# Patient Record
Sex: Female | Born: 1950 | Race: White | Hispanic: No | Marital: Married | Smoking: Never smoker
Health system: Southern US, Community
[De-identification: ages and names within clinical notes are randomized; demographics above are authoritative.]

## PROBLEM LIST (undated history)

## (undated) DIAGNOSIS — I10 Essential (primary) hypertension: Secondary | ICD-10-CM

## (undated) DIAGNOSIS — E119 Type 2 diabetes mellitus without complications: Secondary | ICD-10-CM

## (undated) HISTORY — PX: TONSILLECTOMY: SUR1361

## (undated) HISTORY — PX: BREAST BIOPSY: SHX20

## (undated) HISTORY — PX: BREAST EXCISIONAL BIOPSY: SUR124

---

## 2006-07-13 ENCOUNTER — Ambulatory Visit: Payer: Self-pay

## 2007-09-06 ENCOUNTER — Ambulatory Visit: Payer: Self-pay | Admitting: Emergency Medicine

## 2008-12-03 ENCOUNTER — Ambulatory Visit: Payer: Self-pay | Admitting: Family Medicine

## 2009-03-24 ENCOUNTER — Ambulatory Visit: Payer: Self-pay | Admitting: Otolaryngology

## 2009-10-07 ENCOUNTER — Ambulatory Visit: Payer: Self-pay | Admitting: Family Medicine

## 2010-03-13 ENCOUNTER — Ambulatory Visit: Payer: Self-pay | Admitting: Gastroenterology

## 2010-10-13 ENCOUNTER — Ambulatory Visit: Payer: Self-pay

## 2011-03-18 ENCOUNTER — Ambulatory Visit: Payer: Self-pay | Admitting: Pain Medicine

## 2012-01-13 ENCOUNTER — Ambulatory Visit: Payer: Self-pay

## 2012-05-01 ENCOUNTER — Ambulatory Visit: Payer: Self-pay | Admitting: Internal Medicine

## 2013-06-19 ENCOUNTER — Ambulatory Visit: Payer: Self-pay

## 2014-11-20 ENCOUNTER — Ambulatory Visit: Payer: Self-pay | Admitting: Family Medicine

## 2015-10-19 ENCOUNTER — Encounter: Payer: Self-pay | Admitting: Emergency Medicine

## 2015-10-19 ENCOUNTER — Emergency Department
Admission: EM | Admit: 2015-10-19 | Discharge: 2015-10-19 | Disposition: A | Discharge status: Home / Self Care | Payer: Managed Care, Other (non HMO) | Attending: Emergency Medicine | Admitting: Emergency Medicine

## 2015-10-19 ENCOUNTER — Emergency Department: Payer: Managed Care, Other (non HMO)

## 2015-10-19 DIAGNOSIS — S299XXA Unspecified injury of thorax, initial encounter: Secondary | ICD-10-CM | POA: Diagnosis present

## 2015-10-19 DIAGNOSIS — Y998 Other external cause status: Secondary | ICD-10-CM | POA: Diagnosis not present

## 2015-10-19 DIAGNOSIS — W01198A Fall on same level from slipping, tripping and stumbling with subsequent striking against other object, initial encounter: Secondary | ICD-10-CM | POA: Diagnosis not present

## 2015-10-19 DIAGNOSIS — I10 Essential (primary) hypertension: Secondary | ICD-10-CM | POA: Insufficient documentation

## 2015-10-19 DIAGNOSIS — Y9289 Other specified places as the place of occurrence of the external cause: Secondary | ICD-10-CM | POA: Insufficient documentation

## 2015-10-19 DIAGNOSIS — E119 Type 2 diabetes mellitus without complications: Secondary | ICD-10-CM | POA: Insufficient documentation

## 2015-10-19 DIAGNOSIS — Y9389 Activity, other specified: Secondary | ICD-10-CM | POA: Diagnosis not present

## 2015-10-19 DIAGNOSIS — S20221A Contusion of right back wall of thorax, initial encounter: Secondary | ICD-10-CM | POA: Diagnosis not present

## 2015-10-19 DIAGNOSIS — S2231XA Fracture of one rib, right side, initial encounter for closed fracture: Secondary | ICD-10-CM | POA: Insufficient documentation

## 2015-10-19 HISTORY — DX: Essential (primary) hypertension: I10

## 2015-10-19 HISTORY — DX: Type 2 diabetes mellitus without complications: E11.9

## 2015-10-19 MED ORDER — MELOXICAM 15 MG PO TABS
15.0000 mg | ORAL_TABLET | Freq: Every day | ORAL | Status: DC
Start: 2015-10-19 — End: 2017-11-22

## 2015-10-19 MED ORDER — HYDROCODONE-ACETAMINOPHEN 5-325 MG PO TABS: 1.0000 | ORAL_TABLET | ORAL | Status: DC | PRN

## 2015-10-19 MED ORDER — CYCLOBENZAPRINE HCL 10 MG PO TABS: 10.0000 mg | ORAL_TABLET | Freq: Three times a day (TID) | ORAL | Status: DC | PRN

## 2015-10-19 MED ORDER — HYDROCODONE-ACETAMINOPHEN 5-325 MG PO TABS
2.0000 | ORAL_TABLET | Freq: Once | ORAL | Status: AC
  Administered 2015-10-19: 2 via ORAL
  Filled 2015-10-19: qty 2

## 2015-10-19 NOTE — ED Provider Notes (Signed)
[facility] Emergency Department Provider Note  ____________________________________________  Time seen: Approximately 1:01 PM  I have reviewed the triage vital signs and the nursing notes.   HISTORY  Chief Complaint Fall   HPI Janet Sims is a 64 y.o. female who presents to the emergency department for evaluation of right lower rib pain. She states that she slipped while trying to enter the bathtub last night and hit her rib area on the side of the tub. She can "feel something move" when taking a deep breath or making certain movements. She has taken ibuprofen for pain without relief.She denies shortness of breath.   Past Medical History  Diagnosis Date  . Diabetes mellitus without complication (HCC)   . Hypertension     There are no active problems to display for this patient.   Past Surgical History  Procedure Laterality Date  . Tonsillectomy      Current Outpatient Rx  Name  Route  Sig  Dispense  Refill  . cyclobenzaprine (FLEXERIL) 10 MG tablet   Oral   Take 1 tablet (10 mg total) by mouth 3 (three) times daily as needed for muscle spasms.   30 tablet   0   . HYDROcodone-acetaminophen (NORCO/VICODIN) 5-325 MG tablet   Oral   Take 1 tablet by mouth every 4 (four) hours as needed for moderate pain.   20 tablet   0   . meloxicam (MOBIC) 15 MG tablet   Oral   Take 1 tablet (15 mg total) by mouth daily.   30 tablet   0     Allergies Lisinopril  No family history on file.  Social History Social History  Substance Use Topics  . Smoking status: Never Smoker   . Smokeless tobacco: None  . Alcohol Use: No    Review of Systems Constitutional: No recent illness. Eyes: No visual changes. ENT: No sore throat. Cardiovascular: Denies chest pain or palpitations. Respiratory: Denies shortness of breath. Gastrointestinal: No abdominal pain.  Genitourinary: Negative for dysuria. Musculoskeletal: Pain in right posterior rib  area Skin: Negative for rash. Neurological: Negative for headaches, focal weakness or numbness. 10-point ROS otherwise negative.  ____________________________________________   PHYSICAL EXAM:  VITAL SIGNS: ED Triage Vitals  Enc Vitals Group     BP 10/19/15 1142 159/72 mmHg     Pulse Rate 10/19/15 1142 95     Resp 10/19/15 1142 18     Temp 10/19/15 1142 98.1 F (36.7 C)     Temp Source 10/19/15 1142 Oral     SpO2 10/19/15 1142 96 %     Weight 10/19/15 1142 151 lb (68.493 kg)     Height 10/19/15 1142 5\' 2"  (1.575 m)     Head Cir --      Peak Flow --      Pain Score 10/19/15 1146 5     Pain Loc --      Pain Edu? --      Excl. in GC? --     Constitutional: Alert and oriented. Well appearing and in no acute distress. Eyes: Conjunctivae are normal. EOMI. Head: Atraumatic. Nose: No congestion/rhinnorhea. Neck: No stridor.  Respiratory: Normal respiratory effort. Lungs clear to auscultation bilaterally  Musculoskeletal: Contusion noted over the right posterior thoracic area under the site of pain. There is no flail chest noted on exam. Neurologic:  Normal speech and language. No gross focal neurologic deficits are appreciated. Speech is normal. No gait instability. Skin:  Skin is warm, dry and  intact. Atraumatic. Psychiatric: Mood and affect are normal. Speech and behavior are normal.  ____________________________________________   LABS (all labs ordered are listed, but only abnormal results are displayed)  Labs Reviewed - No data to display ____________________________________________  RADIOLOGY  No acute fracture identified. ____________________________________________   PROCEDURES  Procedure(s) performed: None   ____________________________________________   INITIAL IMPRESSION / ASSESSMENT AND PLAN / ED COURSE  Pertinent labs & imaging results that were available during my care of the patient were reviewed by me and considered in my medical decision making  (see chart for details).  Rib fracture diagnosed on clinical exam.  Patient was given pneumonia precautions. She was advised to return to the emergency department immediately for any shortness of breath, fever, or onset of cough. She was advised to follow-up with her primary care provider for symptoms that are not improving over the next 4 weeks ____________________________________________   FINAL CLINICAL IMPRESSION(S) / ED DIAGNOSES  Final diagnoses:  Closed rib fracture, right, initial encounter       Chinita Pester, FNP 10/19/15 1418  Jene Every, MD 10/19/15 1444

## 2015-10-19 NOTE — Discharge Instructions (Signed)

## 2015-10-19 NOTE — ED Notes (Signed)
States she fell and hit this edge of tub having pain to lower back

## 2016-09-03 ENCOUNTER — Other Ambulatory Visit: Payer: Self-pay | Admitting: Family Medicine

## 2016-09-03 DIAGNOSIS — Z1231 Encounter for screening mammogram for malignant neoplasm of breast: Secondary | ICD-10-CM

## 2016-09-13 ENCOUNTER — Ambulatory Visit
Admission: RE | Admit: 2016-09-13 | Discharge: 2016-09-13 | Disposition: A | Discharge status: Home / Self Care | Payer: Managed Care, Other (non HMO) | Source: Ambulatory Visit | Attending: Family Medicine | Admitting: Family Medicine

## 2016-09-13 DIAGNOSIS — Z1231 Encounter for screening mammogram for malignant neoplasm of breast: Secondary | ICD-10-CM

## 2017-07-14 ENCOUNTER — Other Ambulatory Visit: Payer: Self-pay | Admitting: Family Medicine

## 2017-07-14 DIAGNOSIS — I10 Essential (primary) hypertension: Secondary | ICD-10-CM

## 2017-07-14 DIAGNOSIS — E2839 Other primary ovarian failure: Secondary | ICD-10-CM

## 2017-07-18 ENCOUNTER — Ambulatory Visit
Admission: RE | Admit: 2017-07-18 | Discharge: 2017-07-18 | Disposition: A | Discharge status: Home / Self Care | Payer: Managed Care, Other (non HMO) | Source: Ambulatory Visit | Attending: Family Medicine | Admitting: Family Medicine

## 2017-07-18 DIAGNOSIS — E2839 Other primary ovarian failure: Secondary | ICD-10-CM | POA: Diagnosis not present

## 2017-07-18 DIAGNOSIS — I10 Essential (primary) hypertension: Secondary | ICD-10-CM

## 2017-09-20 ENCOUNTER — Other Ambulatory Visit: Payer: Self-pay | Admitting: Family Medicine

## 2017-09-20 DIAGNOSIS — Z1231 Encounter for screening mammogram for malignant neoplasm of breast: Secondary | ICD-10-CM

## 2017-09-27 ENCOUNTER — Ambulatory Visit
Admission: RE | Admit: 2017-09-27 | Discharge: 2017-09-27 | Disposition: A | Discharge status: Home / Self Care | Payer: 59 | Source: Ambulatory Visit | Attending: Family Medicine | Admitting: Family Medicine

## 2017-09-27 DIAGNOSIS — Z1231 Encounter for screening mammogram for malignant neoplasm of breast: Secondary | ICD-10-CM | POA: Diagnosis present

## 2017-11-22 ENCOUNTER — Ambulatory Visit (INDEPENDENT_AMBULATORY_CARE_PROVIDER_SITE_OTHER): Payer: 59 | Admitting: Urology

## 2017-11-22 ENCOUNTER — Encounter: Payer: Self-pay | Admitting: Urology

## 2017-11-22 VITALS — BP 151/82 | HR 106 | Ht 62.0 in | Wt 159.4 lb

## 2017-11-22 DIAGNOSIS — R102 Pelvic and perineal pain: Secondary | ICD-10-CM | POA: Diagnosis not present

## 2017-11-22 MED ORDER — AMITRIPTYLINE HCL 25 MG PO TABS: 25.0000 mg | ORAL_TABLET | Freq: Every day | ORAL | 5 refills | Status: DC

## 2017-11-22 MED ORDER — ESTRADIOL 0.1 MG/GM VA CREA: TOPICAL_CREAM | VAGINAL | 0 refills | Status: DC

## 2017-11-22 NOTE — Progress Notes (Signed)
11/22/2017 4:04 PM   Janet Sims 1951/01/26 [id number]  Referring provider: Titus MouldWhite, Elizabeth Burney, NP [address] [place], [place] [address]  Chief Complaint  Patient presents with  . Medication Refill    HPI: 66 year old female who [facility] for urinary frequency, urgency, dysuria and pelvic pain.  She had no improvement on immediate release oxybutynin.  She had a small caliber urethra prohibiting clinic cystoscopy.  She underwent urethral dilation and cystoscopy under anesthesia and her urethral caliber was 10 [place].  She had persistent symptoms after dilation and was started on amitriptyline and tolterodine.  She noted significant improvement in her discomfort and voiding symptoms.  She did see Dr. Iona Hansenarey and had no significant pelvic floor tenderness.  Estrace was added for vaginal discomfort which she states was beneficial however she was apparently using too much and has run out of this medication.  She is also run out of her amitriptyline and has noted recurrent discomfort.   PMH: Past Medical History:  Diagnosis Date  . Diabetes mellitus without complication (HCC)   . Hypertension     Surgical History: Past Surgical History:  Procedure Laterality Date  . BREAST BIOPSY Bilateral 09/1991, 1996   neg  . TONSILLECTOMY      Home Medications:  Allergies as of 11/22/2017      Reactions   Influenza A (h1n1) Monovalent Vaccine Anaphylaxis, Other (See Comments)   fever fever   Lisinopril Swelling      Medication List        Accurate as of 11/22/17  4:04 PM. Always use your most recent med list.          amitriptyline 25 MG tablet Commonly known as:  ELAVIL   aspirin EC 81 MG tablet Take 81 mg by mouth.   chlorthalidone 25 MG tablet Commonly known as:  HYGROTON Take by mouth.   estradiol 0.1 MG/GM vaginal cream Commonly known as:  ESTRACE   JARDIANCE 25 MG Tabs tablet Generic drug:  empagliflozin   LIBERTY  GLUCOSE CONTROL Normal Liqd Use 1 each continuously as needed. One touch ultra mini DX: E11.65   losartan 100 MG tablet Commonly known as:  COZAAR   lovastatin 40 MG tablet Commonly known as:  MEVACOR   metFORMIN 1000 MG tablet Commonly known as:  GLUCOPHAGE   metoprolol succinate 50 MG 24 hr tablet Commonly known as:  TOPROL-XL   MULTI-VITAMINS Tabs Take by mouth.   ONETOUCH DELICA LANCETS 33G Misc Inject into the skin.   SOLIQUA 100-33 UNT-MCG/ML Sopn Generic drug:  Insulin Glargine-Lixisenatide   tolterodine 4 MG 24 hr capsule Commonly known as:  DETROL LA Take 4 mg by mouth.       Allergies:  Allergies  Allergen Reactions  . Influenza A (H1n1) Monovalent Vaccine Anaphylaxis and Other (See Comments)    fever fever   . Lisinopril Swelling    Family History: Family History  Problem Relation Age of Onset  . Breast cancer Neg Hx   . Bladder Cancer Neg Hx   . Kidney cancer Neg Hx     Social History:  reports that  has never smoked. she has never used smokeless tobacco. She reports that she does not drink alcohol or use drugs.  ROS: UROLOGY Frequent Urination?: Yes Hard to postpone urination?: No Burning/pain with urination?: Yes Get up at night to urinate?: Yes Leakage of urine?: No Urine stream starts and stops?: No Trouble starting stream?: Yes Do you  have to strain to urinate?: Yes Blood in urine?: No Urinary tract infection?: No Sexually transmitted disease?: No Injury to kidneys or bladder?: No Painful intercourse?: No Weak stream?: No Currently pregnant?: No Vaginal bleeding?: No Last menstrual period?: n  Gastrointestinal Nausea?: No Vomiting?: No Indigestion/heartburn?: No Diarrhea?: No Constipation?: No  Constitutional Fever: No Night sweats?: No Weight loss?: No Fatigue?: No  Skin Skin rash/lesions?: No Itching?: No  Eyes Blurred vision?: No Double vision?: No  Ears/Nose/Throat Sore throat?: No Sinus problems?:  No  Hematologic/Lymphatic Swollen glands?: No Easy bruising?: No  Cardiovascular Leg swelling?: No Chest pain?: No  Respiratory Cough?: No Shortness of breath?: No  Endocrine Excessive thirst?: No  Musculoskeletal Back pain?: No Joint pain?: No  Neurological Headaches?: No Dizziness?: No  Psychologic Depression?: No Anxiety?: No  Physical Exam: BP (!) 151/82 (BP Location: Right Arm, Patient Position: Sitting, Cuff Size: Normal)   Pulse (!) 106   Ht 5\' 2"  (1.575 m)   Wt 159 lb 6.4 oz (72.3 kg)   BMI 29.15 kg/m   Constitutional:  Alert and oriented, No acute distress. HEENT: [place] AT, moist mucus membranes.  Trachea midline, no masses. Cardiovascular: No clubbing, cyanosis, or edema. Respiratory: Normal respiratory effort, no increased work of breathing. GI: Abdomen is soft, nontender, nondistended, no abdominal masses GU: No CVA tenderness.  Skin: No rashes, bruises or suspicious lesions. Lymph: No cervical or inguinal adenopathy. Neurologic: Grossly intact, no focal deficits, moving all 4 extremities. Psychiatric: Normal mood and affect.   Assessment & Plan:   1. 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  Her symptoms are stable on amitriptyline.  This was refilled as well as Estrace and she was instructed only to use a pea-sized amount applied by fingertip twice weekly.  Follow-up 6 months.  If she has worsening frequency and urgency she will call back and we will restart her tolterodine.   Return in about 6 months (around 05/23/2018) for Recheck.    Riki AltesScott C Tanesia Butner, MD  [facility] [address], [address] [place], [place] [address] [phone]

## 2018-06-27 ENCOUNTER — Other Ambulatory Visit: Payer: Self-pay | Admitting: Family Medicine

## 2018-06-27 MED ORDER — AMITRIPTYLINE HCL 25 MG PO TABS: 25.0000 mg | ORAL_TABLET | Freq: Every day | ORAL | 0 refills | Status: AC

## 2018-09-05 ENCOUNTER — Other Ambulatory Visit: Payer: Self-pay | Admitting: Family Medicine

## 2018-09-05 DIAGNOSIS — Z1231 Encounter for screening mammogram for malignant neoplasm of breast: Secondary | ICD-10-CM

## 2018-10-02 ENCOUNTER — Ambulatory Visit
Admission: RE | Admit: 2018-10-02 | Discharge: 2018-10-02 | Disposition: A | Discharge status: Home / Self Care | Payer: 59 | Source: Ambulatory Visit | Attending: Family Medicine | Admitting: Family Medicine

## 2018-10-02 ENCOUNTER — Encounter (INDEPENDENT_AMBULATORY_CARE_PROVIDER_SITE_OTHER): Payer: Self-pay

## 2018-10-02 DIAGNOSIS — Z1231 Encounter for screening mammogram for malignant neoplasm of breast: Secondary | ICD-10-CM

## 2018-11-30 ENCOUNTER — Encounter: Payer: Self-pay | Admitting: Emergency Medicine

## 2018-11-30 ENCOUNTER — Ambulatory Visit
Admission: EM | Admit: 2018-11-30 | Discharge: 2018-11-30 | Disposition: A | Discharge status: Home / Self Care | Payer: 59 | Attending: Family Medicine | Admitting: Family Medicine

## 2018-11-30 ENCOUNTER — Other Ambulatory Visit: Payer: Self-pay

## 2018-11-30 DIAGNOSIS — X500XXA Overexertion from strenuous movement or load, initial encounter: Secondary | ICD-10-CM | POA: Diagnosis not present

## 2018-11-30 DIAGNOSIS — S29019A Strain of muscle and tendon of unspecified wall of thorax, initial encounter: Secondary | ICD-10-CM | POA: Insufficient documentation

## 2018-11-30 LAB — URINALYSIS, COMPLETE (UACMP) WITH MICROSCOPIC
Bacteria, UA: NONE SEEN
Bilirubin Urine: NEGATIVE
Glucose, UA: >1000 mg/dL — AB
Hgb urine dipstick: NEGATIVE
Ketones, ur: NEGATIVE mg/dL
Nitrite: NEGATIVE
PROTEIN: NEGATIVE mg/dL
Specific Gravity, Urine: 1.01 (ref 1.005–1.030)
pH: 6 (ref 5.0–8.0)

## 2018-11-30 LAB — BASIC METABOLIC PANEL
Anion gap: 11 (ref 5–15)
BUN: 20 mg/dL (ref 8–23)
CO2: 25 mmol/L (ref 22–32)
Calcium: 9.2 mg/dL (ref 8.9–10.3)
Chloride: 99 mmol/L (ref 98–111)
Creatinine, Ser: 1.13 mg/dL — ABNORMAL HIGH (ref 0.44–1.00)
GFR calc Af Amer: 59 mL/min — ABNORMAL LOW (ref >60)
GFR, EST NON AFRICAN AMERICAN: 51 mL/min — AB (ref >60)
Glucose, Bld: 318 mg/dL — ABNORMAL HIGH (ref 70–99)
POTASSIUM: 4 mmol/L (ref 3.5–5.1)
SODIUM: 135 mmol/L (ref 135–145)

## 2018-11-30 MED ORDER — CYCLOBENZAPRINE HCL 10 MG PO TABS: 10.0000 mg | ORAL_TABLET | Freq: Three times a day (TID) | ORAL | 0 refills | Status: DC | PRN

## 2018-11-30 MED ORDER — HYDROCODONE-ACETAMINOPHEN 5-325 MG PO TABS: ORAL_TABLET | ORAL | 0 refills | Status: DC

## 2018-11-30 MED ORDER — ONDANSETRON 8 MG PO TBDP: 8.0000 mg | ORAL_TABLET | Freq: Three times a day (TID) | ORAL | 0 refills | Status: DC | PRN

## 2018-11-30 MED ORDER — KETOROLAC TROMETHAMINE 60 MG/2ML IM SOLN
60.0000 mg | Freq: Once | INTRAMUSCULAR | Status: AC
Start: 2018-11-30 — End: 2018-11-30
  Administered 2018-11-30: 60 mg via INTRAMUSCULAR

## 2018-11-30 NOTE — ED Provider Notes (Signed)
[facility]    CSN: [id number] Arrival date & time: 11/30/18  1609     History   Chief Complaint Chief Complaint  Patient presents with  . Flank Pain    HPI Janet Sims is a 67 y.o. female.   67 yo female with a c/o mid back pain radiating to the left side towards the front for the past 2 weeks. States pain started after doing some heavy lifting 2 weeks ago pain has worsened over the last several days. Denies any fevers, chills, bowel or bladder problems/symptoms, cough, chest pain, shortness of breath.   The history is provided by the patient.  Flank Pain     Past Medical History:  Diagnosis Date  . Diabetes mellitus without complication (HCC)   . Hypertension     There are no active problems to display for this patient.   Past Surgical History:  Procedure Laterality Date  . BREAST BIOPSY Bilateral 09/1991, 1996   neg  . TONSILLECTOMY      OB History   No obstetric history on file.      Home Medications    Prior to Admission medications   Medication Sig Start Date End Date Taking? Authorizing Provider  amitriptyline (ELAVIL) 25 MG tablet Take 1 tablet (25 mg total) by mouth at bedtime. 06/27/18  Yes Stoioff, Verna Czech, MD  aspirin EC 81 MG tablet Take 81 mg by mouth.   Yes [provider]  estradiol (ESTRACE) 0.1 MG/GM vaginal cream Apply pea-sized amount to vaginal area by fingertip twice weekly 11/22/17  Yes Stoioff, Verna Czech, MD  JARDIANCE 25 MG TABS tablet  11/09/17  Yes [provider]  losartan (COZAAR) 100 MG tablet  11/15/17  Yes [provider]  lovastatin (MEVACOR) 40 MG tablet  10/25/17  Yes [provider]  metFORMIN (GLUCOPHAGE) 1000 MG tablet  10/25/17  Yes [provider]  metoprolol succinate (TOPROL-XL) 50 MG 24 hr tablet  08/30/17  Yes [provider]  Multiple Vitamin (MULTI-VITAMINS) TABS Take by mouth.   Yes [provider]  Blood Glucose Calibration  (LIBERTY GLUCOSE CONTROL) Normal LIQD Use 1 each continuously as needed. One touch ultra mini DX: E11.65 11/17/16   [provider]  chlorthalidone (HYGROTON) 25 MG tablet Take by mouth. 07/12/17 07/12/18  [provider]  cyclobenzaprine (FLEXERIL) 10 MG tablet Take 1 tablet (10 mg total) by mouth 3 (three) times daily as needed for muscle spasms. 11/30/18   Payton Mccallum, MD  HYDROcodone-acetaminophen (NORCO/VICODIN) 5-325 MG tablet 1-2 tabs po bid prn 11/30/18   Payton Mccallum, MD  ondansetron (ZOFRAN ODT) 8 MG disintegrating tablet Take 1 tablet (8 mg total) by mouth every 8 (eight) hours as needed. 11/30/18   Payton Mccallum, MD  [facility] DELICA LANCETS 33G MISC Inject into the skin. 11/17/16   [provider]  Lawana Chambers 100-33 UNT-MCG/ML SOPN  09/30/17   [provider]  tolterodine (DETROL LA) 4 MG 24 hr capsule Take 4 mg by mouth. 07/25/17   [provider]    Family History Family History  Problem Relation Age of Onset  . Breast cancer Neg Hx   . Bladder Cancer Neg Hx   . Kidney cancer Neg Hx     Social History Social History   Tobacco Use  . Smoking status: Never Smoker  . Smokeless tobacco: Never Used  Substance Use Topics  . Alcohol use: No  . Drug use: No     Allergies  Influenza a (h1n1) monovalent vaccine and Lisinopril   Review of Systems Review of Systems  Genitourinary: Positive for flank pain.     Physical Exam Triage Vital Signs ED Triage Vitals  Enc Vitals Group     BP 11/30/18 1638 (!) 151/78     Pulse Rate 11/30/18 1638 (!) 116     Resp 11/30/18 1638 20     Temp 11/30/18 1638 97.6 F (36.4 C)     Temp src --      SpO2 11/30/18 1638 99 %     Weight 11/30/18 1635 160 lb (72.6 kg)     Height 11/30/18 1635 5\' 2"  (1.575 m)     Head Circumference --      Peak Flow --      Pain Score 11/30/18 1634 10     Pain Loc --      Pain Edu? --      Excl. in GC? --    No data found.  Updated Vital Signs BP (!)  151/78 (BP Location: Left Arm)   Pulse (!) 116   Temp 97.6 F (36.4 C)   Resp 20   Ht 5\' 2"  (1.575 m)   Wt 72.6 kg   SpO2 99%   BMI 29.26 kg/m   Visual Acuity Right Eye Distance:   Left Eye Distance:   Bilateral Distance:    Right Eye Near:   Left Eye Near:    Bilateral Near:     Physical Exam Vitals signs and nursing note reviewed.  Constitutional:      Appearance: She is not toxic-appearing or diaphoretic.  Musculoskeletal:     Thoracic back: She exhibits tenderness (left paraspinous muscles) and spasm. She exhibits no swelling, no edema, no deformity, no laceration and normal pulse.  Neurological:     Mental Status: She is alert.      UC Treatments / Results  Labs (all labs ordered are listed, but only abnormal results are displayed) Labs Reviewed  URINALYSIS, COMPLETE (UACMP) WITH MICROSCOPIC - Abnormal; Notable for the following components:      Result Value   Color, Urine STRAW (*)    Glucose, UA >1000 (*)    Leukocytes, UA TRACE (*)    All other components within normal limits  BASIC METABOLIC PANEL - Abnormal; Notable for the following components:   Glucose, Bld 318 (*)    Creatinine, Ser 1.13 (*)    GFR calc non Af Amer 51 (*)    GFR calc Af Amer 59 (*)    All other components within normal limits    EKG None  Radiology No results found.  Procedures Procedures (including critical care time)  Medications Ordered in UC Medications  ketorolac (TORADOL) injection 60 mg (60 mg Intramuscular Given 11/30/18 1726)    Initial Impression / Assessment and Plan / UC Course  I have reviewed the triage vital signs and the nursing notes.  Pertinent labs & imaging results that were available during my care of the patient were reviewed by me and considered in my medical decision making (see chart for details).      Final Clinical Impressions(s) / UC Diagnoses   Final diagnoses:  Thoracic myofascial strain, initial encounter    ED Prescriptions     Medication Sig Dispense Auth. Provider   cyclobenzaprine (FLEXERIL) 10 MG tablet Take 1 tablet (10 mg total) by mouth 3 (three) times daily as needed for muscle spasms. 30 tablet Payton Mccallumonty, Tremon Sainvil, MD   HYDROcodone-acetaminophen (NORCO/VICODIN) 5-325 MG  tablet 1-2 tabs po bid prn 6 tablet Payton Mccallumonty, Shaquina Gillham, MD   ondansetron (ZOFRAN ODT) 8 MG disintegrating tablet Take 1 tablet (8 mg total) by mouth every 8 (eight) hours as needed. 6 tablet Payton Mccallumonty, Marston Mccadden, MD     1. Lab results and diagnosis reviewed with patient; given toradol 60mg  im x 1 2. rx as per orders above; reviewed possible side effects, interactions, risks and benefits  3. Follow-up tomorrow as scheduled with PCP    Controlled Substance Prescriptions [place] Controlled Substance Registry consulted? Not Applicable   Payton Mccallumonty, Tacia Hindley, MD 11/30/18 1910

## 2018-11-30 NOTE — ED Triage Notes (Signed)
Patient states she is having muscle spasms in her left side radiating around to the front

## 2018-12-14 ENCOUNTER — Other Ambulatory Visit: Payer: Self-pay | Admitting: Physician Assistant

## 2018-12-14 DIAGNOSIS — M546 Pain in thoracic spine: Secondary | ICD-10-CM

## 2018-12-25 ENCOUNTER — Ambulatory Visit
Admission: RE | Admit: 2018-12-25 | Discharge: 2018-12-25 | Disposition: A | Discharge status: Home / Self Care | Payer: Medicare HMO | Source: Ambulatory Visit | Attending: Physician Assistant | Admitting: Physician Assistant

## 2018-12-25 DIAGNOSIS — M546 Pain in thoracic spine: Secondary | ICD-10-CM | POA: Diagnosis present

## 2019-01-21 ENCOUNTER — Other Ambulatory Visit: Payer: Self-pay

## 2019-01-21 ENCOUNTER — Ambulatory Visit
Admission: EM | Admit: 2019-01-21 | Discharge: 2019-01-21 | Disposition: A | Discharge status: Home / Self Care | Payer: Medicare HMO | Attending: Emergency Medicine | Admitting: Emergency Medicine

## 2019-01-21 ENCOUNTER — Encounter: Payer: Self-pay | Admitting: Emergency Medicine

## 2019-01-21 DIAGNOSIS — J069 Acute upper respiratory infection, unspecified: Secondary | ICD-10-CM | POA: Diagnosis not present

## 2019-01-21 DIAGNOSIS — I10 Essential (primary) hypertension: Secondary | ICD-10-CM | POA: Diagnosis not present

## 2019-01-21 MED ORDER — BENZONATATE 200 MG PO CAPS: ORAL_CAPSULE | ORAL | 0 refills | Status: DC

## 2019-01-21 MED ORDER — HYDROCOD POLST-CPM POLST ER 10-8 MG/5ML PO SUER: 5.0000 mL | Freq: Two times a day (BID) | ORAL | 0 refills | Status: DC

## 2019-01-21 NOTE — ED Provider Notes (Signed)
[facility]    CSN: [id number] Arrival date & time: 01/21/19  0801     History   Chief Complaint Chief Complaint  Patient presents with  . Cough  . Sinus Problem    HPI Janet Sims is a 68 y.o. female.   HPI  -year-old female presents with cough chest congestion sinus congestion and pressure that started yesterday.  She states she has been coughing up green mucus.  Her husband has recently been diagnosed with pneumonia and has been treated with dual antibiotics and has been doing well.  To make sure that her cold did not get into pneumonia.  Had no fever or chills.  Dates that she coughed most of the last night and was unable to sleep normally.        Past Medical History:  Diagnosis Date  . Diabetes mellitus without complication (HCC)   . Hypertension     There are no active problems to display for this patient.   Past Surgical History:  Procedure Laterality Date  . BREAST BIOPSY Bilateral 09/1991, 1996   neg  . TONSILLECTOMY      OB History   No obstetric history on file.      Home Medications    Prior to Admission medications   Medication Sig Start Date End Date Taking? Authorizing Provider  amitriptyline (ELAVIL) 25 MG tablet Take 1 tablet (25 mg total) by mouth at bedtime. 06/27/18  Yes Stoioff, Verna Czech, MD  aspirin EC 81 MG tablet Take 81 mg by mouth.   Yes [provider]  chlorthalidone (HYGROTON) 25 MG tablet Take by mouth. 07/12/17 01/21/19 Yes [provider]  estradiol (ESTRACE) 0.1 MG/GM vaginal cream Apply pea-sized amount to vaginal area by fingertip twice weekly 11/22/17  Yes Stoioff, Verna Czech, MD  JARDIANCE 25 MG TABS tablet  11/09/17  Yes [provider]  losartan (COZAAR) 100 MG tablet  11/15/17  Yes [provider]  lovastatin (MEVACOR) 40 MG tablet  10/25/17  Yes [provider]  metFORMIN (GLUCOPHAGE) 1000 MG tablet  10/25/17  Yes [provider]  metoprolol  succinate (TOPROL-XL) 50 MG 24 hr tablet  08/30/17  Yes [provider]  Multiple Vitamin (MULTI-VITAMINS) TABS Take by mouth.   Yes [provider]  tolterodine (DETROL LA) 4 MG 24 hr capsule Take 4 mg by mouth. 07/25/17  Yes [provider]  benzonatate (TESSALON) 200 MG capsule Take one cap TID PRN cough 01/21/19   Lutricia Feil, PA-C  Blood Glucose Calibration (LIBERTY GLUCOSE CONTROL) Normal LIQD Use 1 each continuously as needed. One touch ultra mini DX: E11.65 11/17/16   [provider]  chlorpheniramine-HYDROcodone (TUSSIONEX PENNKINETIC ER) 10-8 MG/5ML SUER Take 5 mLs by mouth 2 (two) times daily. 01/21/19   Lutricia Feil, PA-C  gabapentin (NEURONTIN) 300 MG capsule Take 300 mg by mouth 3 (three) times daily. 12/01/18   [provider]  ondansetron (ZOFRAN ODT) 8 MG disintegrating tablet Take 1 tablet (8 mg total) by mouth every 8 (eight) hours as needed. 11/30/18   Payton Mccallum, MD  [facility] DELICA LANCETS 33G MISC Inject into the skin. 11/17/16   [provider]  OZEMPIC, 0.25 OR 0.5 MG/DOSE, 2 MG/1.5ML SOPN INJECT 0.5MG  SUBCUTANEOUSLY EVERY 7 DAYS 11/12/18   [provider]    Family History Family History  Problem Relation Age of Onset  . Breast cancer Neg Hx   . Bladder Cancer Neg Hx   . Kidney cancer  Neg Hx     Social History Social History   Tobacco Use  . Smoking status: Never Smoker  . Smokeless tobacco: Never Used  Substance Use Topics  . Alcohol use: No  . Drug use: No     Allergies   Influenza a (h1n1) monovalent vaccine and Lisinopril   Review of Systems Review of Systems  Constitutional: Positive for activity change. Negative for chills, fatigue and fever.  HENT: Positive for congestion, postnasal drip, rhinorrhea, sinus pressure and sinus pain.   Respiratory: Positive for cough.   All other systems reviewed and are negative.    Physical Exam Triage Vital Signs ED Triage Vitals    Enc Vitals Group     BP 01/21/19 0815 (!) 151/79     Pulse Rate 01/21/19 0815 100     Resp 01/21/19 0815 16     Temp 01/21/19 0815 98.8 F (37.1 C)     Temp Source 01/21/19 0815 Oral     SpO2 01/21/19 0815 99 %     Weight 01/21/19 0809 160 lb (72.6 kg)     Height 01/21/19 0809 5\' 2"  (1.575 m)     Head Circumference --      Peak Flow --      Pain Score 01/21/19 0809 3     Pain Loc --      Pain Edu? --      Excl. in GC? --    No data found.  Updated Vital Signs BP (!) 151/79 (BP Location: Left Arm)   Pulse 100   Temp 98.8 F (37.1 C) (Oral)   Resp 16   Ht 5\' 2"  (1.575 m)   Wt 160 lb (72.6 kg)   SpO2 99%   BMI 29.26 kg/m   Visual Acuity Right Eye Distance:   Left Eye Distance:   Bilateral Distance:    Right Eye Near:   Left Eye Near:    Bilateral Near:     Physical Exam Vitals signs and nursing note reviewed.  Constitutional:      General: She is not in acute distress.    Appearance: Normal appearance. She is not ill-appearing, toxic-appearing or diaphoretic.  HENT:     Head: Normocephalic and atraumatic.     Right Ear: Tympanic membrane and ear canal normal.     Left Ear: Tympanic membrane and ear canal normal.     Nose: Nose normal. No congestion or rhinorrhea.     Mouth/Throat:     Mouth: Mucous membranes are moist.     Pharynx: Oropharynx is clear.  Eyes:     General:        Right eye: No discharge.        Left eye: No discharge.     Conjunctiva/sclera: Conjunctivae normal.  Neck:     Musculoskeletal: Normal range of motion.  Pulmonary:     Effort: Pulmonary effort is normal.     Breath sounds: Normal breath sounds.  Musculoskeletal: Normal range of motion.  Lymphadenopathy:     Cervical: No cervical adenopathy.  Skin:    General: Skin is warm and dry.  Neurological:     General: No focal deficit present.     Mental Status: She is alert and oriented to person, place, and time.  Psychiatric:        Mood and Affect: Mood normal.         Behavior: Behavior normal.        Thought Content: Thought content normal.  Judgment: Judgment normal.      UC Treatments / Results  Labs (all labs ordered are listed, but only abnormal results are displayed) Labs Reviewed - No data to display  EKG None  Radiology No results found.  Procedures Procedures (including critical care time)  Medications Ordered in UC Medications - No data to display  Initial Impression / Assessment and Plan / UC Course  I have reviewed the triage vital signs and the nursing notes.  Pertinent labs & imaging results that were available during my care of the patient were reviewed by me and considered in my medical decision making (see chart for details).   Patient  has an upper respiratory infection.  We will treat with symptomatic cough suppressants.  Should also consider using Flonase nasal spray for the sinus congestion that she is exhibiting.  If she worsens she may return to our clinic for further evaluation or follow-up with her primary care physician   Final Clinical Impressions(s) / UC Diagnoses   Final diagnoses:  Upper respiratory tract infection, unspecified type     Discharge Instructions     Drink plenty of fluids.  Rest as much as possible.  Use Tylenol or Motrin for fever chills or body aches.    ED Prescriptions    Medication Sig Dispense Auth. Provider   chlorpheniramine-HYDROcodone (TUSSIONEX PENNKINETIC ER) 10-8 MG/5ML SUER Take 5 mLs by mouth 2 (two) times daily. 115 mL Ovid Curd P, PA-C   benzonatate (TESSALON) 200 MG capsule Take one cap TID PRN cough 30 capsule Lutricia Feil, PA-C     Controlled Substance Prescriptions [place] Controlled Substance Registry consulted? Not Applicable   Lutricia Feil, PA-C 01/21/19 [id number]

## 2019-01-21 NOTE — Discharge Instructions (Addendum)
Drink plenty of fluids.  Rest as much as possible.  Use Tylenol or Motrin for fever chills or body aches.  °

## 2019-01-21 NOTE — ED Triage Notes (Signed)
Patient c/o cough, congestion, sinus congestion and pressure that started yesterday.  Patient denies fevers.

## 2019-08-28 ENCOUNTER — Other Ambulatory Visit: Payer: Self-pay | Admitting: Family Medicine

## 2019-08-28 DIAGNOSIS — Z1231 Encounter for screening mammogram for malignant neoplasm of breast: Secondary | ICD-10-CM

## 2019-10-04 ENCOUNTER — Ambulatory Visit
Admission: RE | Admit: 2019-10-04 | Discharge: 2019-10-04 | Disposition: A | Discharge status: Home / Self Care | Payer: Medicare HMO | Source: Ambulatory Visit | Attending: Family Medicine | Admitting: Family Medicine

## 2019-10-04 ENCOUNTER — Other Ambulatory Visit: Payer: Self-pay

## 2019-10-04 ENCOUNTER — Encounter (INDEPENDENT_AMBULATORY_CARE_PROVIDER_SITE_OTHER): Payer: Self-pay

## 2019-10-04 DIAGNOSIS — Z1231 Encounter for screening mammogram for malignant neoplasm of breast: Secondary | ICD-10-CM

## 2019-10-10 ENCOUNTER — Other Ambulatory Visit: Payer: Self-pay | Admitting: Family Medicine

## 2019-10-10 DIAGNOSIS — E2839 Other primary ovarian failure: Secondary | ICD-10-CM

## 2019-10-10 DIAGNOSIS — Z0000 Encounter for general adult medical examination without abnormal findings: Secondary | ICD-10-CM

## 2019-11-28 ENCOUNTER — Inpatient Hospital Stay: Admission: RE | Admit: 2019-11-28 | Payer: Medicare HMO | Source: Ambulatory Visit

## 2019-12-05 ENCOUNTER — Inpatient Hospital Stay: Admission: RE | Admit: 2019-12-05 | Payer: Medicare HMO | Source: Ambulatory Visit

## 2020-01-03 ENCOUNTER — Ambulatory Visit
Admission: RE | Admit: 2020-01-03 | Discharge: 2020-01-03 | Disposition: A | Discharge status: Home / Self Care | Payer: Medicare HMO | Source: Ambulatory Visit | Attending: Family Medicine | Admitting: Family Medicine

## 2020-01-03 ENCOUNTER — Other Ambulatory Visit: Payer: Self-pay

## 2020-01-03 ENCOUNTER — Encounter (INDEPENDENT_AMBULATORY_CARE_PROVIDER_SITE_OTHER): Payer: Self-pay

## 2020-01-03 DIAGNOSIS — Z0000 Encounter for general adult medical examination without abnormal findings: Secondary | ICD-10-CM | POA: Diagnosis not present

## 2020-01-03 DIAGNOSIS — E2839 Other primary ovarian failure: Secondary | ICD-10-CM

## 2020-06-12 ENCOUNTER — Ambulatory Visit
Admission: RE | Admit: 2020-06-12 | Discharge: 2020-06-12 | Disposition: A | Discharge status: Home / Self Care | Payer: Medicare HMO | Attending: Family Medicine | Admitting: Family Medicine

## 2020-06-12 ENCOUNTER — Ambulatory Visit
Admission: RE | Admit: 2020-06-12 | Discharge: 2020-06-12 | Disposition: A | Discharge status: Home / Self Care | Payer: Medicare HMO | Source: Ambulatory Visit | Attending: Family Medicine | Admitting: Family Medicine

## 2020-06-12 ENCOUNTER — Other Ambulatory Visit: Payer: Self-pay

## 2020-06-12 ENCOUNTER — Other Ambulatory Visit: Payer: Self-pay | Admitting: Family Medicine

## 2020-06-12 DIAGNOSIS — R05 Cough: Secondary | ICD-10-CM | POA: Diagnosis present

## 2020-06-12 DIAGNOSIS — R059 Cough, unspecified: Secondary | ICD-10-CM

## 2020-07-17 ENCOUNTER — Other Ambulatory Visit: Payer: Self-pay

## 2020-07-17 ENCOUNTER — Emergency Department: Payer: Medicare HMO

## 2020-07-17 ENCOUNTER — Ambulatory Visit
Admission: EM | Admit: 2020-07-17 | Discharge: 2020-07-17 | Disposition: A | Discharge status: Other Acute Inpatient Hospital | Payer: Medicare HMO | Source: Home / Self Care | Attending: Family Medicine | Admitting: Family Medicine

## 2020-07-17 ENCOUNTER — Encounter: Payer: Self-pay | Admitting: Emergency Medicine

## 2020-07-17 ENCOUNTER — Observation Stay
Admission: EM | Admit: 2020-07-17 | Discharge: 2020-07-18 | Disposition: A | Discharge status: Home / Self Care | Payer: Medicare HMO | Attending: Internal Medicine | Admitting: Internal Medicine

## 2020-07-17 DIAGNOSIS — R300 Dysuria: Secondary | ICD-10-CM | POA: Diagnosis not present

## 2020-07-17 DIAGNOSIS — N390 Urinary tract infection, site not specified: Secondary | ICD-10-CM | POA: Diagnosis present

## 2020-07-17 DIAGNOSIS — R6510 Systemic inflammatory response syndrome (SIRS) of non-infectious origin without acute organ dysfunction: Secondary | ICD-10-CM | POA: Insufficient documentation

## 2020-07-17 DIAGNOSIS — E119 Type 2 diabetes mellitus without complications: Secondary | ICD-10-CM | POA: Diagnosis not present

## 2020-07-17 DIAGNOSIS — I10 Essential (primary) hypertension: Secondary | ICD-10-CM

## 2020-07-17 DIAGNOSIS — Z7982 Long term (current) use of aspirin: Secondary | ICD-10-CM | POA: Insufficient documentation

## 2020-07-17 DIAGNOSIS — Z79899 Other long term (current) drug therapy: Secondary | ICD-10-CM | POA: Insufficient documentation

## 2020-07-17 DIAGNOSIS — Z20822 Contact with and (suspected) exposure to covid-19: Secondary | ICD-10-CM | POA: Diagnosis not present

## 2020-07-17 DIAGNOSIS — R509 Fever, unspecified: Secondary | ICD-10-CM | POA: Diagnosis present

## 2020-07-17 DIAGNOSIS — N10 Acute tubulo-interstitial nephritis: Secondary | ICD-10-CM | POA: Diagnosis not present

## 2020-07-17 DIAGNOSIS — R110 Nausea: Secondary | ICD-10-CM | POA: Insufficient documentation

## 2020-07-17 DIAGNOSIS — N12 Tubulo-interstitial nephritis, not specified as acute or chronic: Secondary | ICD-10-CM

## 2020-07-17 DIAGNOSIS — A419 Sepsis, unspecified organism: Secondary | ICD-10-CM

## 2020-07-17 DIAGNOSIS — N179 Acute kidney failure, unspecified: Secondary | ICD-10-CM

## 2020-07-17 DIAGNOSIS — N930 Postcoital and contact bleeding: Secondary | ICD-10-CM | POA: Diagnosis not present

## 2020-07-17 DIAGNOSIS — E1165 Type 2 diabetes mellitus with hyperglycemia: Secondary | ICD-10-CM

## 2020-07-17 LAB — URINALYSIS, COMPLETE (UACMP) WITH MICROSCOPIC
Bilirubin Urine: NEGATIVE
Bilirubin Urine: NEGATIVE
Glucose, UA: 500 mg/dL — AB
Glucose, UA: >=500 mg/dL — AB
Hgb urine dipstick: NEGATIVE
Ketones, ur: NEGATIVE mg/dL
Ketones, ur: NEGATIVE mg/dL
Nitrite: NEGATIVE
Nitrite: NEGATIVE
Protein, ur: 100 mg/dL — AB
Protein, ur: NEGATIVE mg/dL
Specific Gravity, Urine: 1.02 (ref 1.005–1.030)
Specific Gravity, Urine: 1.009 (ref 1.005–1.030)
WBC, UA: >50 WBC/hpf (ref 0–5)
WBC, UA: >50 WBC/hpf — ABNORMAL HIGH (ref 0–5)
pH: 7 (ref 5.0–8.0)
pH: 7 (ref 5.0–8.0)

## 2020-07-17 LAB — CBC WITH DIFFERENTIAL/PLATELET
Abs Immature Granulocytes: 0.03 10*3/uL (ref 0.00–0.07)
Basophils Absolute: 0 10*3/uL (ref 0.0–0.1)
Basophils Relative: 0 %
Eosinophils Absolute: 0 10*3/uL (ref 0.0–0.5)
Eosinophils Relative: 0 %
HCT: 32.1 % — ABNORMAL LOW (ref 36.0–46.0)
Hemoglobin: 10.9 g/dL — ABNORMAL LOW (ref 12.0–15.0)
Immature Granulocytes: 0 %
Lymphocytes Relative: 19 %
Lymphs Abs: 1.3 10*3/uL (ref 0.7–4.0)
MCH: 30 pg (ref 26.0–34.0)
MCHC: 34 g/dL (ref 30.0–36.0)
MCV: 88.4 fL (ref 80.0–100.0)
Monocytes Absolute: 0.6 10*3/uL (ref 0.1–1.0)
Monocytes Relative: 9 %
Neutro Abs: 4.8 10*3/uL (ref 1.7–7.7)
Neutrophils Relative %: 72 %
Platelets: 223 10*3/uL (ref 150–400)
RBC: 3.63 MIL/uL — ABNORMAL LOW (ref 3.87–5.11)
RDW: 13 % (ref 11.5–15.5)
WBC: 6.8 10*3/uL (ref 4.0–10.5)
nRBC: 0 % (ref 0.0–0.2)

## 2020-07-17 LAB — COMPREHENSIVE METABOLIC PANEL
ALT: 35 U/L (ref 0–44)
AST: 42 U/L — ABNORMAL HIGH (ref 15–41)
Albumin: 3.6 g/dL (ref 3.5–5.0)
Alkaline Phosphatase: 50 U/L (ref 38–126)
Anion gap: 12 (ref 5–15)
BUN: 21 mg/dL (ref 8–23)
CO2: 23 mmol/L (ref 22–32)
Calcium: 8.8 mg/dL — ABNORMAL LOW (ref 8.9–10.3)
Chloride: 97 mmol/L — ABNORMAL LOW (ref 98–111)
Creatinine, Ser: 1.19 mg/dL — ABNORMAL HIGH (ref 0.44–1.00)
GFR calc Af Amer: 55 mL/min — ABNORMAL LOW (ref >60)
GFR calc non Af Amer: 48 mL/min — ABNORMAL LOW (ref >60)
Glucose, Bld: 62 mg/dL — ABNORMAL LOW (ref 70–99)
Potassium: 3.4 mmol/L — ABNORMAL LOW (ref 3.5–5.1)
Sodium: 132 mmol/L — ABNORMAL LOW (ref 135–145)
Total Bilirubin: 0.8 mg/dL (ref 0.3–1.2)
Total Protein: 7.2 g/dL (ref 6.5–8.1)

## 2020-07-17 LAB — LACTIC ACID, PLASMA
Lactic Acid, Venous: 2.1 mmol/L (ref 0.5–1.9)
Lactic Acid, Venous: 1.9 mmol/L (ref 0.5–1.9)

## 2020-07-17 LAB — PROTIME-INR
INR: 1.1 (ref 0.8–1.2)
Prothrombin Time: 13.6 seconds (ref 11.4–15.2)

## 2020-07-17 LAB — GLUCOSE, CAPILLARY
Glucose-Capillary: 119 mg/dL — ABNORMAL HIGH (ref 70–99)
Glucose-Capillary: 158 mg/dL — ABNORMAL HIGH (ref 70–99)

## 2020-07-17 LAB — HEMOGLOBIN A1C
Hgb A1c MFr Bld: 7.2 % — ABNORMAL HIGH (ref 4.8–5.6)
Mean Plasma Glucose: 159.94 mg/dL

## 2020-07-17 LAB — APTT: aPTT: 33 seconds (ref 24–36)

## 2020-07-17 LAB — SARS CORONAVIRUS 2 BY RT PCR (HOSPITAL ORDER, PERFORMED IN ~~LOC~~ HOSPITAL LAB): SARS Coronavirus 2: NEGATIVE

## 2020-07-17 MED ORDER — ONDANSETRON 8 MG PO TBDP
8.0000 mg | ORAL_TABLET | Freq: Once | ORAL | Status: AC
  Administered 2020-07-17: 8 mg via ORAL

## 2020-07-17 MED ORDER — SODIUM CHLORIDE 0.9% FLUSH
3.0000 mL | Freq: Two times a day (BID) | INTRAVENOUS | Status: DC
  Administered 2020-07-18: 3 mL via INTRAVENOUS

## 2020-07-17 MED ORDER — ACETAMINOPHEN 325 MG PO TABS: 650.0000 mg | ORAL_TABLET | Freq: Four times a day (QID) | ORAL | Status: DC | PRN

## 2020-07-17 MED ORDER — SODIUM CHLORIDE 0.9 % IV SOLN
1.0000 g | INTRAVENOUS | Status: DC
  Filled 2020-07-17: qty 10

## 2020-07-17 MED ORDER — ACETAMINOPHEN 650 MG RE SUPP: 650.0000 mg | Freq: Four times a day (QID) | RECTAL | Status: DC | PRN

## 2020-07-17 MED ORDER — SODIUM CHLORIDE 0.9 % IV BOLUS
1000.0000 mL | Freq: Once | INTRAVENOUS | Status: AC
  Administered 2020-07-17: 1000 mL via INTRAVENOUS

## 2020-07-17 MED ORDER — SODIUM CHLORIDE 0.9 % IV BOLUS (SEPSIS)
1000.0000 mL | Freq: Once | INTRAVENOUS | Status: AC
  Administered 2020-07-17: 1000 mL via INTRAVENOUS

## 2020-07-17 MED ORDER — INSULIN ASPART 100 UNIT/ML ~~LOC~~ SOLN: 0.0000 [IU] | Freq: Every day | SUBCUTANEOUS | Status: DC

## 2020-07-17 MED ORDER — AMITRIPTYLINE HCL 25 MG PO TABS
25.0000 mg | ORAL_TABLET | Freq: Every day | ORAL | Status: DC
  Administered 2020-07-17: 25 mg via ORAL
  Filled 2020-07-17 (×2): qty 1

## 2020-07-17 MED ORDER — SODIUM CHLORIDE 0.9 % IV SOLN: 1.0000 g | INTRAVENOUS | Status: DC

## 2020-07-17 MED ORDER — SODIUM CHLORIDE 0.9 % IV BOLUS (SEPSIS)
500.0000 mL | Freq: Once | INTRAVENOUS | Status: AC
  Administered 2020-07-17: 500 mL via INTRAVENOUS

## 2020-07-17 MED ORDER — LACTATED RINGERS IV SOLN: INTRAVENOUS | Status: DC

## 2020-07-17 MED ORDER — ASPIRIN EC 81 MG PO TBEC
81.0000 mg | DELAYED_RELEASE_TABLET | Freq: Every day | ORAL | Status: DC
  Administered 2020-07-18: 81 mg via ORAL
  Filled 2020-07-17: qty 1

## 2020-07-17 MED ORDER — ONDANSETRON HCL 4 MG PO TABS: 4.0000 mg | ORAL_TABLET | Freq: Four times a day (QID) | ORAL | Status: DC | PRN

## 2020-07-17 MED ORDER — GABAPENTIN 300 MG PO CAPS
300.0000 mg | ORAL_CAPSULE | Freq: Three times a day (TID) | ORAL | Status: DC
  Filled 2020-07-17 (×2): qty 1

## 2020-07-17 MED ORDER — ENOXAPARIN SODIUM 40 MG/0.4ML ~~LOC~~ SOLN
40.0000 mg | SUBCUTANEOUS | Status: DC
  Administered 2020-07-17: 40 mg via SUBCUTANEOUS
  Filled 2020-07-17: qty 0.4

## 2020-07-17 MED ORDER — ONDANSETRON HCL 4 MG/2ML IJ SOLN: 4.0000 mg | Freq: Four times a day (QID) | INTRAMUSCULAR | Status: DC | PRN

## 2020-07-17 MED ORDER — INSULIN ASPART 100 UNIT/ML ~~LOC~~ SOLN
0.0000 [IU] | Freq: Three times a day (TID) | SUBCUTANEOUS | Status: DC
  Administered 2020-07-18: 5 [IU] via SUBCUTANEOUS
  Administered 2020-07-18: 2 [IU] via SUBCUTANEOUS
  Filled 2020-07-17 (×2): qty 1

## 2020-07-17 MED ORDER — SODIUM CHLORIDE 0.9 % IV SOLN
1.0000 g | Freq: Once | INTRAVENOUS | Status: AC
  Administered 2020-07-17: 1 g via INTRAVENOUS
  Filled 2020-07-17: qty 10

## 2020-07-17 MED ORDER — POLYETHYLENE GLYCOL 3350 17 G PO PACK: 17.0000 g | PACK | Freq: Every day | ORAL | Status: DC | PRN

## 2020-07-17 MED ORDER — ACETAMINOPHEN 500 MG PO TABS
1000.0000 mg | ORAL_TABLET | Freq: Once | ORAL | Status: AC
  Administered 2020-07-17: 1000 mg via ORAL

## 2020-07-17 NOTE — ED Provider Notes (Signed)
[facility]    CSN: [id number] Arrival date & time: 07/17/20  1154  History   Chief Complaint Chief Complaint  Patient presents with  . Fever  . Nausea  . Dysuria   HPI  69 year old female presents with the above complaints.  Appears in pain and in distress.  Ill-appearing.  States that her symptoms started yesterday.  Worsened today.  She reports chills, sweating, nausea, dysuria, urinary frequency.  She also reports right-sided back/flank pain.  Pain 4/10 in severity currently.  However, she appears to be in distress.  Patient has a history of UTI.  She reports ongoing chronic cough the past 8 months.  No other reported symptoms.  Past Medical History:  Diagnosis Date  . Diabetes mellitus without complication (HCC)   . Hypertension    Past Surgical History:  Procedure Laterality Date  . BREAST BIOPSY Bilateral 09/1991, 1996   neg  . TONSILLECTOMY      OB History   No obstetric history on file.    Home Medications    Prior to Admission medications   Medication Sig Start Date End Date Taking? Authorizing Provider  amitriptyline (ELAVIL) 25 MG tablet Take 1 tablet (25 mg total) by mouth at bedtime. 06/27/18  Yes Stoioff, Verna Czech, MD  aspirin EC 81 MG tablet Take 81 mg by mouth.   Yes [provider]  benzonatate (TESSALON) 200 MG capsule Take one cap TID PRN cough 01/21/19  Yes Ovid Curd P, PA-C  Blood Glucose Calibration (LIBERTY GLUCOSE CONTROL) Normal LIQD Use 1 each continuously as needed. One touch ultra mini DX: E11.65 11/17/16  Yes [provider]  chlorpheniramine-HYDROcodone (TUSSIONEX PENNKINETIC ER) 10-8 MG/5ML SUER Take 5 mLs by mouth 2 (two) times daily. 01/21/19  Yes Lutricia Feil, PA-C  estradiol (ESTRACE) 0.1 MG/GM vaginal cream Apply pea-sized amount to vaginal area by fingertip twice weekly 11/22/17  Yes Stoioff, Verna Czech, MD  gabapentin (NEURONTIN) 300 MG capsule Take 300 mg by mouth 3 (three) times daily. 12/01/18   Yes [provider]  JARDIANCE 25 MG TABS tablet  11/09/17  Yes [provider]  losartan (COZAAR) 100 MG tablet  11/15/17  Yes [provider]  lovastatin (MEVACOR) 40 MG tablet  10/25/17  Yes [provider]  metFORMIN (GLUCOPHAGE) 1000 MG tablet  10/25/17  Yes [provider]  metoprolol succinate (TOPROL-XL) 50 MG 24 hr tablet  08/30/17  Yes [provider]  Multiple Vitamin (MULTI-VITAMINS) TABS Take by mouth.   Yes [provider]  ondansetron (ZOFRAN ODT) 8 MG disintegrating tablet Take 1 tablet (8 mg total) by mouth every 8 (eight) hours as needed. 11/30/18  Yes Payton Mccallum, MD  [facility] DELICA LANCETS 33G MISC Inject into the skin. 11/17/16  Yes [provider]  OZEMPIC, 0.25 OR 0.5 MG/DOSE, 2 MG/1.5ML SOPN INJECT 0.5MG  SUBCUTANEOUSLY EVERY 7 DAYS 11/12/18  Yes [provider]  tolterodine (DETROL LA) 4 MG 24 hr capsule Take 4 mg by mouth. 07/25/17  Yes [provider]  chlorthalidone (HYGROTON) 25 MG tablet Take by mouth. 07/12/17 01/21/19  [provider]    Family History Family History  Problem Relation Age of Onset  . Breast cancer Neg Hx   . Bladder Cancer Neg Hx   . Kidney cancer Neg Hx     Social History Social History   Tobacco Use  . Smoking status: Never Smoker  . Smokeless tobacco: Never Used  Vaping Use  . Vaping Use: Never used  Substance Use Topics  . Alcohol use: No  . Drug use: No     Allergies   Influenza a (h1n1) monovalent vaccine and Lisinopril   Review of Systems Review of Systems  Constitutional: Positive for chills and fever.  Gastrointestinal: Positive for nausea.  Genitourinary: Positive for dysuria, flank pain and frequency.   Physical Exam Triage Vital Signs ED Triage Vitals  Enc Vitals Group     BP 07/17/20 1311 (!) 89/60     Pulse Rate 07/17/20 1311 (!) 121     Resp 07/17/20 1311 (!) 24     Temp 07/17/20 1311 (!) 103 F (39.4 C)      Temp Source 07/17/20 1311 Oral     SpO2 07/17/20 1311 100 %     Weight 07/17/20 1312 182 lb (82.6 kg)     Height 07/17/20 1312 5\' 2"  (1.575 m)     Head Circumference --      Peak Flow --      Pain Score 07/17/20 1311 4     Pain Loc --      Pain Edu? --      Excl. in GC? --    Updated Vital Signs BP 137/87 (BP Location: Right Arm)   Pulse (!) 113   Temp (!) 102 F (38.9 C) (Oral)   Resp 20   Ht 5\' 2"  (1.575 m)   Wt 82.6 kg   SpO2 100%   BMI 33.29 kg/m   Visual Acuity Right Eye Distance:   Left Eye Distance:   Bilateral Distance:    Right Eye Near:   Left Eye Near:    Bilateral Near:     Physical Exam Vitals and nursing note reviewed.  Constitutional:      Appearance: She is ill-appearing.  HENT:     Head: Normocephalic and atraumatic.  Eyes:     General:        Right eye: No discharge.        Left eye: No discharge.     Conjunctiva/sclera: Conjunctivae normal.  Cardiovascular:     Rate and Rhythm: Regular rhythm. Tachycardia present.  Pulmonary:     Comments: Mild increased work of breathing.  I suspect this is secondary to pain. Abdominal:     General: There is no distension.     Palpations: Abdomen is soft.     Tenderness: There is right CVA tenderness.  Neurological:     Mental Status: She is alert.  Psychiatric:        Mood and Affect: Mood normal.        Behavior: Behavior normal.    UC Treatments / Results  Labs (all labs ordered are listed, but only abnormal results are displayed) Labs Reviewed  URINALYSIS, COMPLETE (UACMP) WITH MICROSCOPIC - Abnormal; Notable for the following components:      Result Value   APPearance CLOUDY (*)    Glucose, UA 500 (*)    Hgb urine dipstick TRACE (*)    Protein, ur 100 (*)    Leukocytes,Ua SMALL (*)    Bacteria, UA MANY (*)    All other components within normal limits  URINE CULTURE    EKG   Radiology No results found.  Procedures Procedures (including critical care time)  Medications Ordered  in UC Medications  ondansetron (ZOFRAN-ODT) disintegrating tablet 8 mg (8 mg Oral Given 07/17/20 1324)  sodium chloride 0.9 % bolus 1,000 mL (1,000 mLs Intravenous New Bag/Given 07/17/20 1347)  acetaminophen (TYLENOL) tablet 1,000 mg (1,000  mg Oral Given 07/17/20 1347)    Initial Impression / Assessment and Plan / UC Course  I have reviewed the triage vital signs and the nursing notes.  Pertinent labs & imaging results that were available during my care of the patient were reviewed by me and considered in my medical decision making (see chart for details).    69 year old female presents with suspected urosepsis..  Patient febrile at 103, tachycardic with heart rate of 121, hypotensive upon arrival with a blood pressure of 89/60.  Patient acutely ill.  Urinalysis consistent with UTI.  Patient with CVA tenderness on the right.  IV placed.  IV fluids going.  Patient needs further evaluation in the ER and likely admission for sepsis based on current clinical picture.  Patient being transported via EMS to the hospital.  Final Clinical Impressions(s) / UC Diagnoses   Final diagnoses:  Pyelonephritis  SIRS (systemic inflammatory response syndrome) (HCC)   Discharge Instructions   None    ED Prescriptions    None     PDMP not reviewed this encounter.   Tommie Sams, [place] 07/17/20 1448

## 2020-07-17 NOTE — ED Triage Notes (Signed)
Pt to ED via [facility] from [facility] UC for sepsis alert. Pt c/o malaise, chills, fever x 3 days.  Pt alert and oriented  EMS reports pt temp 101.9 orally, tylenol given at UC, sinus tach on monitor, 80s systolic BP, 96% on RA Pt alert and oriented, clear speech Denies CP, SHOB

## 2020-07-17 NOTE — H&P (Signed)
History and Physical    Janet Sims TFT:[id number] DOB: 21-Apr-1951 DOA: 07/17/2020  PCP: Henrietta Hoover, MD   Patient coming from: Home  I have personally briefly reviewed patient's old medical records in [place]  Chief Complaint: Fever, chills and right flank pain.  HPI: Janet Sims is a 69 y.o. female with medical history significant of diabetes and hypertension came to ED with complaint of fever, chills, sweating, nausea, vomiting, dysuria, increased urinary frequency and right-sided flank pain since yesterday.  Has an history of UTI but never got that sick before.  Denies any upper respiratory symptoms or sick contacts.  No chest pain or shortness of breath.  Decreased appetite, no diarrhea. Patient is vaccinated.  ED Course: On arrival patient was febrile at 103, tachycardic, tachypneic and hypotensive with blood pressure of 89/60, labs with no leukocytes, hemoglobin of 10.9, sodium of 132, potassium of 3.4, blood glucose of 62, creatinine of 1.19, lactic acid of 2.1, UA with pyuria, blood and urine cultures drawn.  Chest x-ray without any acute abnormality.  COVID-19 pending.  Review of Systems: As per HPI otherwise 10 point review of systems negative.   Past Medical History:  Diagnosis Date  . Diabetes mellitus without complication ([place])   . Hypertension     Past Surgical History:  Procedure Laterality Date  . BREAST BIOPSY Bilateral 09/1991, 1996   neg  . TONSILLECTOMY       reports that she has never smoked. She has never used smokeless tobacco. She reports that she does not drink alcohol and does not use drugs.  Allergies  Allergen Reactions  . Influenza A (H1n1) Monovalent Vaccine Anaphylaxis and Other (See Comments)    fever fever   . Lisinopril Swelling    Family History  Problem Relation Age of Onset  . Breast cancer Neg Hx   . Bladder Cancer Neg Hx   . Kidney cancer Neg Hx     Prior to Admission medications    Medication Sig Start Date End Date Taking? Authorizing Provider  amitriptyline (ELAVIL) 25 MG tablet Take 1 tablet (25 mg total) by mouth at bedtime. 06/27/18   Stoioff, Ronda Fairly, MD  aspirin EC 81 MG tablet Take 81 mg by mouth.    [provider]  benzonatate (TESSALON) 200 MG capsule Take one cap TID PRN cough 01/21/19   Lorin Picket, PA-C  Blood Glucose Calibration (LIBERTY GLUCOSE CONTROL) Normal LIQD Use 1 each continuously as needed. One touch ultra mini DX: E11.65 11/17/16   [provider]  chlorpheniramine-HYDROcodone (TUSSIONEX PENNKINETIC ER) 10-8 MG/5ML SUER Take 5 mLs by mouth 2 (two) times daily. 01/21/19   Lorin Picket, PA-C  chlorthalidone (HYGROTON) 25 MG tablet Take by mouth. 07/12/17 01/21/19  [provider]  estradiol (ESTRACE) 0.1 MG/GM vaginal cream Apply pea-sized amount to vaginal area by fingertip twice weekly 11/22/17   Stoioff, Ronda Fairly, MD  gabapentin (NEURONTIN) 300 MG capsule Take 300 mg by mouth 3 (three) times daily. 12/01/18   [provider]  JARDIANCE 25 MG TABS tablet  11/09/17   [provider]  losartan (COZAAR) 100 MG tablet  11/15/17   [provider]  lovastatin (MEVACOR) 40 MG tablet  10/25/17   [provider]  metFORMIN (GLUCOPHAGE) 1000 MG tablet  10/25/17   [provider]  metoprolol succinate (TOPROL-XL) 50 MG 24 hr tablet  08/30/17   [provider]  Multiple Vitamin (MULTI-VITAMINS) TABS Take by mouth.  [provider]  ondansetron (ZOFRAN ODT) 8 MG disintegrating tablet Take 1 tablet (8 mg total) by mouth every 8 (eight) hours as needed. 11/30/18   Norval Gable, MD  [facility] DELICA LANCETS 03X MISC Inject into the skin. 11/17/16   [provider]  OZEMPIC, 0.25 OR 0.5 MG/DOSE, 2 MG/1.5ML SOPN INJECT 0.5MG SUBCUTANEOUSLY EVERY 7 DAYS 11/12/18   [provider]  tolterodine (DETROL LA) 4 MG 24 hr capsule Take 4 mg by mouth. 07/25/17    [provider]    Physical Exam: Vitals:   07/17/20 1440 07/17/20 1441 07/17/20 1444 07/17/20 1600  BP:   (!) 150/70   Pulse: (!) 115     Resp: (!) 22     Temp: 100.3 F (37.9 C)   98.6 F (37 C)  TempSrc: Oral   Oral  SpO2: 96%     Weight:  82.5 kg    Height:  _0  (1.575 m)      General: Vital signs reviewed.  Patient is well-developed and well-nourished, in no acute distress and cooperative with exam.  Head: Normocephalic and atraumatic. Eyes: EOMI, conjunctivae normal, no scleral icterus.  ENMT: Mucous membranes are moist.  Neck: Supple, trachea midline, normal ROM, no JVD, masses, thyromegaly, or carotid bruit present.  Cardiovascular: RRR, S1 normal, S2 normal, no murmurs, gallops, or rubs. Pulmonary/Chest: Clear to auscultation bilaterally, no wheezes, rales, or rhonchi. Abdominal: Soft, right CVA tenderness, non-distended, BS +,  Extremities: No lower extremity edema bilaterally,  pulses symmetric and intact bilaterally. No cyanosis or clubbing. Neurological: A&O x3, Strength is normal and symmetric bilaterally, cranial nerve II-XII are grossly intact, no focal motor deficit, sensory intact to light touch bilaterally.  Skin: Warm, dry and intact. No rashes or erythema. Psychiatric: Normal mood and affect. speech and behavior is normal. Cognition and memory are normal.   Labs on Admission: I have personally reviewed following labs and imaging studies  CBC: Recent Labs  Lab 07/17/20 1443  WBC 6.8  NEUTROABS 4.8  HGB 10.9*  HCT 32.1*  MCV 88.4  PLT 281   Basic Metabolic Panel: Recent Labs  Lab 07/17/20 1443  NA 132*  K 3.4*  CL 97*  CO2 23  GLUCOSE 62*  BUN 21  CREATININE 1.19*  CALCIUM 8.8*   GFR: Estimated Creatinine Clearance: 46.3 mL/min (A) (by C-G formula based on SCr of 1.19 mg/dL (H)). Liver Function Tests: Recent Labs  Lab 07/17/20 1443  AST 42*  ALT 35  ALKPHOS 50  BILITOT 0.8  PROT 7.2  ALBUMIN 3.6   No results for  input(s): LIPASE, AMYLASE in the last 168 hours. No results for input(s): AMMONIA in the last 168 hours. Coagulation Profile: Recent Labs  Lab 07/17/20 1443  INR 1.1   Cardiac Enzymes: No results for input(s): CKTOTAL, CKMB, CKMBINDEX, TROPONINI in the last 168 hours. BNP (last 3 results) No results for input(s): PROBNP in the last 8760 hours. HbA1C: No results for input(s): HGBA1C in the last 72 hours. CBG: No results for input(s): GLUCAP in the last 168 hours. Lipid Profile: No results for input(s): CHOL, HDL, LDLCALC, TRIG, CHOLHDL, LDLDIRECT in the last 72 hours. Thyroid Function Tests: No results for input(s): TSH, T4TOTAL, FREET4, T3FREE, THYROIDAB in the last 72 hours. Anemia Panel: No results for input(s): VITAMINB12, FOLATE, FERRITIN, TIBC, IRON, RETICCTPCT in the last 72 hours. Urine analysis:    Component Value Date/Time   COLORURINE YELLOW (A) 07/17/2020 1443   APPEARANCEUR HAZY (A) 07/17/2020 1443  LABSPEC 1.009 07/17/2020 1443   PHURINE 7.0 07/17/2020 1443   GLUCOSEU >=500 (A) 07/17/2020 1443   HGBUR NEGATIVE 07/17/2020 1443   BILIRUBINUR NEGATIVE 07/17/2020 1443   KETONESUR NEGATIVE 07/17/2020 1443   PROTEINUR NEGATIVE 07/17/2020 1443   NITRITE NEGATIVE 07/17/2020 1443   LEUKOCYTESUR MODERATE (A) 07/17/2020 1443    Radiological Exams on Admission: DG Chest Portable 1 View  Result Date: 07/17/2020 CLINICAL DATA:  Sepsis EXAM: PORTABLE CHEST 1 VIEW COMPARISON:  06/12/2020 FINDINGS: Lung volumes are small, but are symmetric and are clear. No pneumothorax or pleural effusion. Cardiomediastinal silhouette unremarkable. Pulmonary vascularity is normal. No acute bone abnormality. IMPRESSION: No active disease. Electronically Signed   By: Fidela Salisbury MD   On: 07/17/2020 15:31    EKG: Independently reviewed.  Sinus tachycardia.  Assessment/Plan Active Problems:   UTI (urinary tract infection)   Sepsis secondary to UTI.  Patient met initially sepsis criteria  with being febrile, tachycardic, tachypneic, lactic acidosis, AKI and UA looking infected. She received 2.5 L of fluid in ED and started on ceftriaxone. -Continue ceftriaxone-until urinary cultures become available. -Check renal stone studies due to right CVA tenderness to rule out pyelonephritis/renal calculi. -Continue IV fluids with LR at 100 mL/h. -Pain management. -Follow-up blood cultures and lactic acid.  Hypertension.  Initially softer blood pressure on presentation. Blood pressure responded well to IV fluid. -Monitor without any home antihypertensives-which can be restarted if needed.  Diabetes mellitus. -Check A1c -SSI   DVT prophylaxis: Lovenox Code Status: Full code Family Communication: No family at bedside Disposition Plan: Home Consults called: None Admission status: Observation   Lorella Nimrod MD [facility]  If 7PM-7AM, please contact night-coverage [email or web address]  07/17/2020, 4:47 PM   This record has been created using Systems analyst. Errors have been sought and corrected,but may not always be located. Such creation errors do not reflect on the standard of care.

## 2020-07-17 NOTE — ED Triage Notes (Signed)
Patient c/o chills, sweating, nausea that started this morning. She also c/o urinary frequency and dysuria. Patient is very uncomfortable during triage.

## 2020-07-17 NOTE — ED Notes (Signed)
Dr Darnelle Catalan notified of lactic 2.1, orders to be placed

## 2020-07-17 NOTE — Progress Notes (Signed)
CODE SEPSIS - PHARMACY COMMUNICATION  **Broad Spectrum Antibiotics should be administered within 1 hour of Sepsis diagnosis**  Time Code Sepsis Called/Page Received: 1545  Antibiotics Ordered: ceftriaxone  Time of 1st antibiotic administration: 1549  Additional action taken by pharmacy: NA  If necessary, Name of Provider/Nurse Contacted: NA    Pricilla Riffle ,PharmD Clinical Pharmacist  07/17/2020  4:08 PM

## 2020-07-17 NOTE — ED Notes (Signed)
EMS called

## 2020-07-17 NOTE — ED Notes (Signed)
Patient is being discharged from the Urgent Care and sent to the Emergency Department via EMS . Per Dr. Adriana Simas, patient is in need of higher level of care due to sepsis. Patient is aware and verbalizes understanding of plan of care.  Vitals:   07/17/20 1311 07/17/20 1406  BP: (!) 89/60 137/87  Pulse: (!) 121 (!) 113  Resp: (!) 24 20  Temp: (!) 103 F (39.4 C) (!) 102 F (38.9 C)  SpO2: 100%

## 2020-07-18 DIAGNOSIS — R6520 Severe sepsis without septic shock: Secondary | ICD-10-CM

## 2020-07-18 DIAGNOSIS — A419 Sepsis, unspecified organism: Secondary | ICD-10-CM | POA: Diagnosis not present

## 2020-07-18 DIAGNOSIS — I10 Essential (primary) hypertension: Secondary | ICD-10-CM | POA: Diagnosis not present

## 2020-07-18 DIAGNOSIS — N390 Urinary tract infection, site not specified: Secondary | ICD-10-CM | POA: Diagnosis not present

## 2020-07-18 DIAGNOSIS — E1165 Type 2 diabetes mellitus with hyperglycemia: Secondary | ICD-10-CM | POA: Diagnosis not present

## 2020-07-18 DIAGNOSIS — Z794 Long term (current) use of insulin: Secondary | ICD-10-CM

## 2020-07-18 DIAGNOSIS — N179 Acute kidney failure, unspecified: Secondary | ICD-10-CM

## 2020-07-18 LAB — HIV ANTIBODY (ROUTINE TESTING W REFLEX): HIV Screen 4th Generation wRfx: NONREACTIVE

## 2020-07-18 LAB — GLUCOSE, CAPILLARY
Glucose-Capillary: 205 mg/dL — ABNORMAL HIGH (ref 70–99)
Glucose-Capillary: 136 mg/dL — ABNORMAL HIGH (ref 70–99)

## 2020-07-18 LAB — BASIC METABOLIC PANEL
Anion gap: 9 (ref 5–15)
BUN: 16 mg/dL (ref 8–23)
CO2: 24 mmol/L (ref 22–32)
Calcium: 8.5 mg/dL — ABNORMAL LOW (ref 8.9–10.3)
Chloride: 99 mmol/L (ref 98–111)
Creatinine, Ser: 1.07 mg/dL — ABNORMAL HIGH (ref 0.44–1.00)
GFR calc Af Amer: >60 mL/min (ref >60)
GFR calc non Af Amer: 54 mL/min — ABNORMAL LOW (ref >60)
Glucose, Bld: 100 mg/dL — ABNORMAL HIGH (ref 70–99)
Potassium: 3.9 mmol/L (ref 3.5–5.1)
Sodium: 132 mmol/L — ABNORMAL LOW (ref 135–145)

## 2020-07-18 LAB — CBC
HCT: 28.9 % — ABNORMAL LOW (ref 36.0–46.0)
Hemoglobin: 9.6 g/dL — ABNORMAL LOW (ref 12.0–15.0)
MCH: 30 pg (ref 26.0–34.0)
MCHC: 33.2 g/dL (ref 30.0–36.0)
MCV: 90.3 fL (ref 80.0–100.0)
Platelets: 212 10*3/uL (ref 150–400)
RBC: 3.2 MIL/uL — ABNORMAL LOW (ref 3.87–5.11)
RDW: 13.2 % (ref 11.5–15.5)
WBC: 5.5 10*3/uL (ref 4.0–10.5)
nRBC: 0 % (ref 0.0–0.2)

## 2020-07-18 MED ORDER — CEPHALEXIN 500 MG PO CAPS
500.0000 mg | ORAL_CAPSULE | Freq: Three times a day (TID) | ORAL | Status: DC
  Administered 2020-07-18: 500 mg via ORAL
  Filled 2020-07-18: qty 1

## 2020-07-18 MED ORDER — CEPHALEXIN 500 MG PO CAPS: 500.0000 mg | ORAL_CAPSULE | Freq: Three times a day (TID) | ORAL | 0 refills | Status: AC

## 2020-07-18 NOTE — Discharge Summary (Signed)
[facility] NAME: Janeece Blok    MR#:  [id number]  DATE OF BIRTH:  Feb 23, 1951  DATE OF ADMISSION:  07/17/2020 ADMITTING PHYSICIAN: Lorella Nimrod, MD  DATE OF DISCHARGE: 07/18/2020  PRIMARY CARE PHYSICIAN: Bynum, Gershon Crane, MD    ADMISSION DIAGNOSIS:  UTI (urinary tract infection) [N39.0]  DISCHARGE DIAGNOSIS:    SECONDARY DIAGNOSIS:   Past Medical History:  Diagnosis Date  . Diabetes mellitus without complication ([place])   . Hypertension     HOSPITAL COURSE:   Anahlia Iseminger is a 69 y.o. female with medical history significant of diabetes and hypertension came to ED with complaint of fever, chills, sweating, nausea, vomiting, dysuria, increased urinary frequency and right-sided flank pain since yesterday.  Has an history of UTI but never got that sick before. Sepsis secondary to UTI with AKI.  Patient met initially sepsis criteria with being febrile, tachycardic, tachypneic, lactic acidosis, AKI and UA looking infected. -now resolved -BC negative -UC GNR >100K per lab (prelim) She received 2.5 L of fluid in ED and started on ceftriaxone--change to po keflex  -Pain management. -Follow-up  lactic acid normal -CT renal--no obstructive uropathy -creat improved  Hypertension.  Initially softer blood pressure on presentation. Blood pressure responded well to IV fluid. -will resume BP meds at d/c  Diabetes mellitus. -cont home meds and insulin -SSI  Overall feels better and requesting to go home.  DVT prophylaxis: Lovenox Code Status: Full code Family Communication: Husband at bedside Disposition Plan: Home Consults called: None Admission status: Observation  D/c home  CONSULTS OBTAINED:    DRUG ALLERGIES:   Allergies  Allergen Reactions  . Influenza A (H1n1) Monovalent Vaccine Anaphylaxis and Other (See Comments)    fever fever   . Lisinopril Swelling    Causes cough    DISCHARGE  MEDICATIONS:   Allergies as of 07/18/2020      Reactions   Influenza A (h1n1) Monovalent Vaccine Anaphylaxis, Other (See Comments)   fever fever   Lisinopril Swelling   Causes cough      Medication List    TAKE these medications   amitriptyline 25 MG tablet Commonly known as: ELAVIL Take 1 tablet (25 mg total) by mouth at bedtime.   aspirin EC 81 MG tablet Take 81 mg by mouth.   cephALEXin 500 MG capsule Commonly known as: KEFLEX Take 1 capsule (500 mg total) by mouth 3 (three) times daily for 6 days.   chlorthalidone 25 MG tablet Commonly known as: HYGROTON Take 25 mg by mouth daily.   glimepiride 4 MG tablet Commonly known as: AMARYL Take 4 mg by mouth every morning.   Jardiance 25 MG Tabs tablet Generic drug: empagliflozin Take 12.5 mg by mouth daily.   Liberty Glucose Control Normal Liqd Use 1 each continuously as needed. One touch ultra mini DX: E11.65   losartan 100 MG tablet Commonly known as: COZAAR Take 100 mg by mouth daily.   lovastatin 40 MG tablet Commonly known as: MEVACOR Take 40 mg by mouth at bedtime.   metFORMIN 1000 MG tablet Commonly known as: GLUCOPHAGE Take 1,000 mg by mouth daily with breakfast.   metoprolol succinate 100 MG 24 hr tablet Commonly known as: TOPROL-XL Take 100 mg by mouth daily.   Multi-Vitamins Tabs Take 1 tablet by mouth daily.   NovoLIN 70/30 ReliOn (70-30) 100 UNIT/ML injection Generic drug: insulin NPH-regular Human Inject 60-80 Units into the skin 2 (two) times daily with a  meal. Inject 80 units subcutaneously with breakfast and 60 units with supper   OneTouch Delica Lancets 72C Misc Inject into the skin.       If you experience worsening of your admission symptoms, develop shortness of breath, life threatening emergency, suicidal or homicidal thoughts you must seek medical attention immediately by calling 911 or calling your MD immediately  if symptoms less severe.  You Must read complete  instructions/literature along with all the possible adverse reactions/side effects for all the Medicines you take and that have been prescribed to you. Take any new Medicines after you have completely understood and accept all the possible adverse reactions/side effects.   Please note  You were cared for by a hospitalist during your hospital stay. If you have any questions about your discharge medications or the care you received while you were in the hospital after you are discharged, you can call the unit and asked to speak with the hospitalist on call if the hospitalist that took care of you is not available. Once you are discharged, your primary care physician will handle any further medical issues. Please note that NO REFILLS for any discharge medications will be authorized once you are discharged, as it is imperative that you return to your primary care physician (or establish a relationship with a primary care physician if you do not have one) for your aftercare needs so that they can reassess your need for medications and monitor your lab values. Today   SUBJECTIVE   Doing well No fever, N or V  VITAL SIGNS:  Blood pressure 131/75, pulse 98, temperature 97.7 F (36.5 C), temperature source Oral, resp. rate 16, height 5' 2"  (1.575 m), weight 82.5 kg, SpO2 98 %.  I/O:    Intake/Output Summary (Last 24 hours) at 07/18/2020 1317 Last data filed at 07/18/2020 0300 Gross per 24 hour  Intake 999.42 ml  Output 300 ml  Net 699.42 ml    PHYSICAL EXAMINATION:  GENERAL:  69 y.o.-year-old patient lying in the bed with no acute distress.  EYES: Pupils equal, round, reactive to light and accommodation. No scleral icterus.  HEENT: Head atraumatic, normocephalic. Oropharynx and nasopharynx clear.  NECK:  Supple, no jugular venous distention. No thyroid enlargement, no tenderness.  LUNGS: Normal breath sounds bilaterally, no wheezing, rales,rhonchi or crepitation. No use of accessory muscles of  respiration.  CARDIOVASCULAR: S1, S2 normal. No murmurs, rubs, or gallops.  ABDOMEN: Soft, non-tender, non-distended. Bowel sounds present. No organomegaly or mass.  EXTREMITIES: No pedal edema, cyanosis, or clubbing.  NEUROLOGIC: Cranial nerves II through XII are intact. Muscle strength 5/5 in all extremities. Sensation intact. Gait not checked.  PSYCHIATRIC: The patient is alert and oriented x 3.  SKIN: No obvious rash, lesion, or ulcer.   DATA REVIEW:   CBC  Recent Labs  Lab 07/18/20 0534  WBC 5.5  HGB 9.6*  HCT 28.9*  PLT 212    Chemistries  Recent Labs  Lab 07/17/20 1443 07/17/20 1443 07/18/20 0534  NA 132*   < > 132*  K 3.4*   < > 3.9  CL 97*   < > 99  CO2 23   < > 24  GLUCOSE 62*   < > 100*  BUN 21   < > 16  CREATININE 1.19*   < > 1.07*  CALCIUM 8.8*   < > 8.5*  AST 42*  --   --   ALT 35  --   --   ALKPHOS 50  --   --  BILITOT 0.8  --   --    < > = values in this interval not displayed.    Microbiology Results   Recent Results (from the past 240 hour(s))  Culture, blood (Routine x 2)     Status: None (Preliminary result)   Collection Time: 07/17/20  2:43 PM   Specimen: BLOOD  Result Value Ref Range Status   Specimen Description BLOOD BLOOD RIGHT HAND  Final   Special Requests   Final    BOTTLES DRAWN AEROBIC AND ANAEROBIC Blood Culture adequate volume   Culture   Final    NO GROWTH < 24 HOURS Performed at [facility], [address] [place], [place] [address]    Report Status PENDING  Incomplete  Culture, blood (Routine x 2)     Status: None (Preliminary result)   Collection Time: 07/17/20  2:43 PM   Specimen: BLOOD  Result Value Ref Range Status   Specimen Description BLOOD BLOOD RIGHT FOREARM  Final   Special Requests   Final    BOTTLES DRAWN AEROBIC AND ANAEROBIC Blood Culture adequate volume   Culture   Final    NO GROWTH < 24 HOURS Performed at [facility], [address], [place], [place] [address]    Report  Status PENDING  Incomplete  Urine culture     Status: Abnormal (Preliminary result)   Collection Time: 07/17/20  2:43 PM   Specimen: Urine, Random  Result Value Ref Range Status   Specimen Description   Final    URINE, RANDOM Performed at [facility], [address] [place], [place] [address]    Special Requests   Final    NONE Performed at [facility], [address], [place], [place] [address]    Culture >=100,000 COLONIES/mL GRAM NEGATIVE RODS (A)  Final   Report Status PENDING  Incomplete  SARS Coronavirus 2 by RT PCR (hospital order, performed in [place] hospital lab) Nasopharyngeal Nasopharyngeal Swab     Status: None   Collection Time: 07/17/20  3:54 PM   Specimen: Nasopharyngeal Swab  Result Value Ref Range Status   SARS Coronavirus 2 NEGATIVE NEGATIVE Final    Comment: (NOTE) SARS-CoV-2 target nucleic acids are NOT DETECTED.  The SARS-CoV-2 RNA is generally detectable in upper and lower respiratory specimens during the acute phase of infection. The lowest concentration of SARS-CoV-2 viral copies this assay can detect is 250 copies / mL. A negative result does not preclude SARS-CoV-2 infection and should not be used as the sole basis for treatment or other patient management decisions.  A negative result may occur with improper specimen collection / handling, submission of specimen other than nasopharyngeal swab, presence of viral mutation(s) within the areas targeted by this assay, and inadequate number of viral copies (<250 copies / mL). A negative result must be combined with clinical observations, patient history, and epidemiological information.  Fact Sheet for Patients:   StrictlyIdeas.no  Fact Sheet for Healthcare Providers: BankingDealers.co.za  This test is not yet approved or  cleared by the [place] FDA and has been authorized for detection and/or diagnosis of SARS-CoV-2 by FDA  under an Emergency Use Authorization (EUA).  This EUA will remain in effect (meaning this test can be used) for the duration of the COVID-19 declaration under Section 564(b)(1) of the Act, 21 U.S.C. section 360bbb-3(b)(1), unless the authorization is terminated or revoked sooner.  Performed at [facility], [address] [place], [place] [address]  RADIOLOGY:  DG Chest Portable 1 View  Result Date: 07/17/2020 CLINICAL DATA:  Sepsis EXAM: PORTABLE CHEST 1 VIEW COMPARISON:  06/12/2020 FINDINGS: Lung volumes are small, but are symmetric and are clear. No pneumothorax or pleural effusion. Cardiomediastinal silhouette unremarkable. Pulmonary vascularity is normal. No acute bone abnormality. IMPRESSION: No active disease. Electronically Signed   By: Fidela Salisbury MD   On: 07/17/2020 15:31   CT Renal Stone Study  Result Date: 07/17/2020 CLINICAL DATA:  69 year old female with flank pain. Concern for kidney stone. EXAM: CT ABDOMEN AND PELVIS WITHOUT CONTRAST TECHNIQUE: Multidetector CT imaging of the abdomen and pelvis was performed following the standard protocol without IV contrast. COMPARISON:  None. FINDINGS: Evaluation of this exam is limited in the absence of intravenous contrast. Lower chest: The visualized lung bases are clear. There is coronary vascular calcification. There is hypoattenuation of the cardiac blood pool suggestive of anemia. Clinical correlation is recommended. No intra-abdominal free air or free fluid. Hepatobiliary: Diffuse fatty liver. No intrahepatic biliary ductal dilatation. The gallbladder is unremarkable. Pancreas: Unremarkable. No pancreatic ductal dilatation or surrounding inflammatory changes. Spleen: Normal in size without focal abnormality. Adrenals/Urinary Tract: The adrenal glands unremarkable. There is a 2 cm left renal interpolar hypodense lesion which is not characterized but demonstrates fluid attenuation, likely a cyst. Subcentimeter high  attenuating lesion in the interpolar aspect of the right kidney is also not characterized but may represent a proteinaceous cyst. There is no hydronephrosis or nephrolithiasis on either side. Slight enlarged appearance of the left kidney. Correlation with urinalysis recommended to exclude pyelonephritis. The visualized ureters and urinary bladder appear unremarkable. Stomach/Bowel: There is no bowel obstruction or active inflammation. The appendix is normal. Vascular/Lymphatic: Advanced aortoiliac atherosclerotic disease. The IVC is unremarkable. No portal venous gas. There is no adenopathy. Reproductive: The uterus and ovaries are grossly unremarkable. Other: None Musculoskeletal: Old healed right posterior rib fractures. No acute osseous pathology. IMPRESSION: 1. No hydronephrosis or nephrolithiasis. Slight enlarged appearance of the left kidney. Correlation with urinalysis recommended to exclude pyelonephritis. 2. Fatty liver. 3. No bowel obstruction. Normal appendix. 4. Aortic Atherosclerosis (ICD10-I70.0). Electronically Signed   By: Anner Crete M.D.   On: 07/17/2020 17:01     CODE STATUS:     Code Status Orders  (From admission, onward)         Start     Ordered   07/17/20 1640  Full code  Continuous        07/17/20 1640        Code Status History    This patient has a current code status but no historical code status.   Advance Care Planning Activity    Advance Directive Documentation     Most Recent Value  Type of Advance Directive Healthcare Power of Attorney, Living will  Pre-existing out of facility DNR order (yellow form or pink MOST form) --  "MOST" Form in Place? --       TOTAL TIME TAKING CARE OF THIS PATIENT: *35* minutes.    Fritzi Mandes M.D  [facility]    CC: Primary care physician; [place], [place] Shirline Frees, MD

## 2020-07-18 NOTE — Discharge Instructions (Signed)
Keep log of your sugars and BP at home and review with PCP at f/u

## 2020-07-18 NOTE — Progress Notes (Signed)
Pt discharged per MD order. IV removed. Discharge instructions reviewed with pt. Pt verbalized understanding. 

## 2020-07-18 NOTE — Progress Notes (Signed)
   07/18/20 0115  Assess: MEWS Score  Temp 98 F (36.7 C)  BP 97/68  Pulse Rate (!) 108  Resp 16  SpO2 98 %  Assess: MEWS Score  MEWS Temp 0  MEWS Systolic 1  MEWS Pulse 1  MEWS RR 0  MEWS LOC 0  MEWS Score 2  MEWS Score Color Yellow  Assess: if the MEWS score is Yellow or Red  Were vital signs taken at a resting state? No  MEWS guidelines implemented *See Row Information* No, other (Comment) (patient had just walked, and was coughing)  Treat  MEWS Interventions Other (Comment)

## 2020-07-19 LAB — URINE CULTURE
Culture: >=100000 — AB
Culture: >=100000 — AB

## 2020-07-20 NOTE — ED Provider Notes (Signed)
[facility] Emergency Department Provider Note   ____________________________________________   First MD Initiated Contact with Patient 07/17/20 1504     (approximate)  I have reviewed the triage vital signs and the nursing notes.   HISTORY  Chief Complaint Code Sepsis   HPI Janet Sims is a 69 y.o. female who comes in complaining of left lower back pain with some nausea and sweating.  She has some urinary urgency and frequency.   She had a high fever and tachycardia was felt to be septic and transferred to the emergency room here she also was uncomfortable complaining of nausea and vomiting CVA tenderness and appeared to have pyelonephritis with sepsis.  Pain was moderate in the CVA area achy made worse with movement and percussion.  Symptoms have been going on since the day before.     Past Medical History:  Diagnosis Date  . Diabetes mellitus without complication (H[facility])   . Hypertension     Patient Active Problem List   Diagnosis Date Noted  . Sepsis with acute renal failure without septic shock (H[facility])   . Essential hypertension   . Type 2 diabetes mellitus with hyperglycemia, with long-term current use of insulin (H[facility])   . UTI (urinary tract infection) 07/17/2020    Past Surgical History:  Procedure Laterality Date  . BREAST BIOPSY Bilateral 09/1991, 1996   neg  . TONSILLECTOMY      Prior to Admission medications   Medication Sig Start Date End Date Taking? Authorizing Provider  aspirin EC 81 MG tablet Take 81 mg by mouth.   Yes [provider]  chlorthalidone (HYGROTON) 25 MG tablet Take 25 mg by mouth daily.  07/12/17 07/18/20 Yes [provider]  glimepiride (AMARYL) 4 MG tablet Take 4 mg by mouth every morning. 04/29/20  Yes [provider]  JARDIANCE 25 MG TABS tablet Take 12.5 mg by mouth daily.  11/09/17  Yes [provider]  losartan (COZAAR) 100 MG tablet Take 100 mg by mouth daily.  11/15/17   Yes [provider]  metFORMIN (GLUCOPHAGE) 1000 MG tablet Take 1,000 mg by mouth daily with breakfast.  10/25/17  Yes [provider]  metoprolol succinate (TOPROL-XL) 100 MG 24 hr tablet Take 100 mg by mouth daily. 04/24/20  Yes [provider]  Multiple Vitamin (MULTI-VITAMINS) TABS Take 1 tablet by mouth daily.    Yes [provider]  NOVOLIN 70/30 RELION (70-30) 100 UNIT/ML injection Inject 60-80 Units into the skin 2 (two) times daily with a meal. Inject 80 units subcutaneously with breakfast and 60 units with supper 06/20/20  Yes [provider]  amitriptyline (ELAVIL) 25 MG tablet Take 1 tablet (25 mg total) by mouth at bedtime. 06/27/18   Stoioff, Verna Czech, MD  Blood Glucose Calibration (LIBERTY GLUCOSE CONTROL) Normal LIQD Use 1 each continuously as needed. One touch ultra mini DX: E11.65 11/17/16   [provider]  cephALEXin (KEFLEX) 500 MG capsule Take 1 capsule (500 mg total) by mouth 3 (three) times daily for 6 days. 07/18/20 07/24/20  Enedina Finner, MD  lovastatin (MEVACOR) 40 MG tablet Take 40 mg by mouth at bedtime.  10/25/17   [provider]  Dola Argyle LANCETS 33G MISC Inject into the skin. 11/17/16   [provider]    Allergies Influenza a (h1n1) monovalent vaccine and Lisinopril  Family History  Problem Relation Age of Onset  . Breast cancer Neg Hx   . Bladder Cancer Neg Hx   .  Kidney cancer Neg Hx     Social History Social History   Tobacco Use  . Smoking status: Never Smoker  . Smokeless tobacco: Never Used  Vaping Use  . Vaping Use: Never used  Substance Use Topics  . Alcohol use: No  . Drug use: No    Review of Systems  Constitutional: fever/chills Eyes: No visual changes. ENT: No sore throat. Cardiovascular: Denies chest pain. Respiratory: Denies shortness of breath. Gastrointestinal: No abdominal pain.  No nausea, no vomiting.  No diarrhea.  No constipation. Genitourinary:   dysuria. Musculoskeletal: back pain. Skin: Negative for rash. Neurological: Negative for headaches, focal weakness    ____________________________________________   PHYSICAL EXAM:  VITAL SIGNS: ED Triage Vitals  Enc Vitals Group     BP 07/17/20 1444 (!) 150/70     Pulse Rate 07/17/20 1440 (!) 115     Resp 07/17/20 1440 (!) 22     Temp 07/17/20 1440 100.3 F (37.9 C)     Temp Source 07/17/20 1440 Oral     SpO2 07/17/20 1440 96 %     Weight 07/17/20 1441 181 lb 14.1 oz (82.5 kg)     Height 07/17/20 1441 5\' 2"  (1.575 m)     Head Circumference --      Peak Flow --      Pain Score 07/17/20 1440 0     Pain Loc --      Pain Edu? --      Excl. in GC? --     Constitutional: Alert and oriented.  Looks uncomfortable Eyes: Conjunctivae are normal.  Head: Atraumatic. Nose: No congestion/rhinnorhea. Mouth/Throat: Mucous membranes are moist.  Oropharynx non-erythematous. Neck: No stridor. Cardiovascular: Rapid rate, regular rhythm. Grossly normal heart sounds.  Good peripheral circulation. Respiratory: Normal respiratory effort.  No retractions. Lungs CTAB. Gastrointestinal: Soft and nontender. No distention. No abdominal bruits.  CVA tenderness. Musculoskeletal: No lower extremity tenderness nor edema.   Neurologic:  Normal speech and language. No gross focal neurologic deficits are appreciated.  Skin:  Skin is warm, dry and intact. No rash noted.   ____________________________________________   LABS (all labs ordered are listed, but only abnormal results are displayed)  Labs Reviewed  URINE CULTURE - Abnormal; Notable for the following components:      Result Value   Culture >=100,000 COLONIES/mL ESCHERICHIA COLI (*)    Organism ID, Bacteria ESCHERICHIA COLI (*)    All other components within normal limits  COMPREHENSIVE METABOLIC PANEL - Abnormal; Notable for the following components:   Sodium 132 (*)    Potassium 3.4 (*)    Chloride 97 (*)    Glucose, Bld 62 (*)     Creatinine, Ser 1.19 (*)    Calcium 8.8 (*)    AST 42 (*)    GFR calc non Af Amer 48 (*)    GFR calc Af Amer 55 (*)    All other components within normal limits  LACTIC ACID, PLASMA - Abnormal; Notable for the following components:   Lactic Acid, Venous 2.1 (*)    All other components within normal limits  CBC WITH DIFFERENTIAL/PLATELET - Abnormal; Notable for the following components:   RBC 3.63 (*)    Hemoglobin 10.9 (*)    HCT 32.1 (*)    All other components within normal limits  URINALYSIS, COMPLETE (UACMP) WITH MICROSCOPIC - Abnormal; Notable for the following components:   Color, Urine YELLOW (*)    APPearance HAZY (*)    Glucose, UA >=500 (*)  Leukocytes,Ua MODERATE (*)    WBC, UA >50 (*)    Bacteria, UA RARE (*)    All other components within normal limits  HEMOGLOBIN A1C - Abnormal; Notable for the following components:   Hgb A1c MFr Bld 7.2 (*)    All other components within normal limits  GLUCOSE, CAPILLARY - Abnormal; Notable for the following components:   Glucose-Capillary 119 (*)    All other components within normal limits  BASIC METABOLIC PANEL - Abnormal; Notable for the following components:   Sodium 132 (*)    Glucose, Bld 100 (*)    Creatinine, Ser 1.07 (*)    Calcium 8.5 (*)    GFR calc non Af Amer 54 (*)    All other components within normal limits  CBC - Abnormal; Notable for the following components:   RBC 3.20 (*)    Hemoglobin 9.6 (*)    HCT 28.9 (*)    All other components within normal limits  GLUCOSE, CAPILLARY - Abnormal; Notable for the following components:   Glucose-Capillary 158 (*)    All other components within normal limits  GLUCOSE, CAPILLARY - Abnormal; Notable for the following components:   Glucose-Capillary 205 (*)    All other components within normal limits  GLUCOSE, CAPILLARY - Abnormal; Notable for the following components:   Glucose-Capillary 136 (*)    All other components within normal limits  CULTURE, BLOOD  (ROUTINE X 2)  CULTURE, BLOOD (ROUTINE X 2)  SARS CORONAVIRUS 2 BY RT PCR (HOSPITAL ORDER, PERFORMED IN [facility])  LACTIC ACID, PLASMA  PROTIME-INR  APTT  HIV ANTIBODY (ROUTINE TESTING W REFLEX)   ____________________________________________  EKG   ____________________________________________  RADIOLOGY  ED MD interpretation:    Official radiology report(s): No results found.  ____________________________________________   PROCEDURES  Procedure(s) performed (including Critical Care):  Procedures   ____________________________________________   INITIAL IMPRESSION / ASSESSMENT AND PLAN / ED COURSE  Patient's initial lactic acid was 2.1 with greater than 50 WBCs in the urine.  Symptoms temperature etc. are consistent with sepsis and pyelonephritis.  Patient was treated with fluids and antibiotics and improved.  CT of the kidneys was done showed no obstruction.           ____________________________________________   FINAL CLINICAL IMPRESSION(S) / ED DIAGNOSES  Final diagnoses:  None     ED Discharge Orders         Ordered    cephALEXin (KEFLEX) 500 MG capsule  3 times daily     Discontinue  Reprint     07/18/20 1317    Increase activity slowly     Discontinue     07/18/20 1317    Diet - low sodium heart healthy     Discontinue     07/18/20 1317           Note:  This document was prepared using Dragon voice recognition software and may include unintentional dictation errors.    Arnaldo Natal, MD 07/20/20 [phone]

## 2020-07-22 LAB — CULTURE, BLOOD (ROUTINE X 2)
Culture: NO GROWTH
Culture: NO GROWTH
Special Requests: ADEQUATE
Special Requests: ADEQUATE

## 2020-09-04 ENCOUNTER — Other Ambulatory Visit: Payer: Self-pay | Admitting: Family Medicine

## 2020-09-04 DIAGNOSIS — Z1231 Encounter for screening mammogram for malignant neoplasm of breast: Secondary | ICD-10-CM

## 2020-10-07 ENCOUNTER — Ambulatory Visit: Payer: Medicare HMO

## 2020-10-17 ENCOUNTER — Other Ambulatory Visit: Payer: Self-pay

## 2020-10-17 ENCOUNTER — Ambulatory Visit
Admission: RE | Admit: 2020-10-17 | Discharge: 2020-10-17 | Disposition: A | Discharge status: Home / Self Care | Payer: Medicare HMO | Source: Ambulatory Visit | Attending: Family Medicine | Admitting: Family Medicine

## 2020-10-17 DIAGNOSIS — Z1231 Encounter for screening mammogram for malignant neoplasm of breast: Secondary | ICD-10-CM | POA: Diagnosis not present

## 2021-09-07 ENCOUNTER — Other Ambulatory Visit: Payer: Self-pay | Admitting: Family Medicine

## 2021-09-07 DIAGNOSIS — Z1231 Encounter for screening mammogram for malignant neoplasm of breast: Secondary | ICD-10-CM

## 2021-10-19 ENCOUNTER — Ambulatory Visit: Payer: Medicare HMO

## 2021-11-13 LAB — COLOGUARD: COLOGUARD: POSITIVE — AB

## 2021-12-29 ENCOUNTER — Ambulatory Visit
Admission: RE | Admit: 2021-12-29 | Discharge: 2021-12-29 | Disposition: A | Discharge status: Home / Self Care | Payer: Medicare Other | Source: Ambulatory Visit | Attending: Family Medicine | Admitting: Family Medicine

## 2021-12-29 ENCOUNTER — Other Ambulatory Visit: Payer: Self-pay

## 2021-12-29 DIAGNOSIS — Z1231 Encounter for screening mammogram for malignant neoplasm of breast: Secondary | ICD-10-CM

## 2022-04-06 ENCOUNTER — Other Ambulatory Visit
Admission: RE | Admit: 2022-04-06 | Discharge: 2022-04-06 | Disposition: A | Discharge status: Home / Self Care | Payer: Medicare Other | Source: Ambulatory Visit | Attending: Ophthalmology | Admitting: Ophthalmology

## 2022-04-06 DIAGNOSIS — M3500 Sicca syndrome, unspecified: Secondary | ICD-10-CM | POA: Diagnosis present

## 2022-04-07 LAB — ANA: Anti Nuclear Antibody (ANA): POSITIVE — AB

## 2022-04-07 LAB — SJOGRENS SYNDROME-B EXTRACTABLE NUCLEAR ANTIBODY: SSB (La) (ENA) Antibody, IgG: <0.2 AI (ref 0.0–0.9)

## 2022-04-07 LAB — SJOGRENS SYNDROME-A EXTRACTABLE NUCLEAR ANTIBODY: SSA (Ro) (ENA) Antibody, IgG: <0.2 AI (ref 0.0–0.9)

## 2022-06-09 IMAGING — MG MM DIGITAL SCREENING BILAT W/ TOMO AND CAD
6 of 10 series · 6 of 30 positions shown · non-contrast
Comparison: Previous exam(s).

CLINICAL DATA: Screening.

EXAM:
DIGITAL SCREENING BILATERAL MAMMOGRAM WITH TOMOSYNTHESIS AND CAD
TECHNIQUE: Bilateral screening digital craniocaudal and mediolateral oblique
mammograms were obtained. Bilateral screening digital breast
tomosynthesis was performed. The images were evaluated with
computer-aided detection.

[R CC synth-2D]
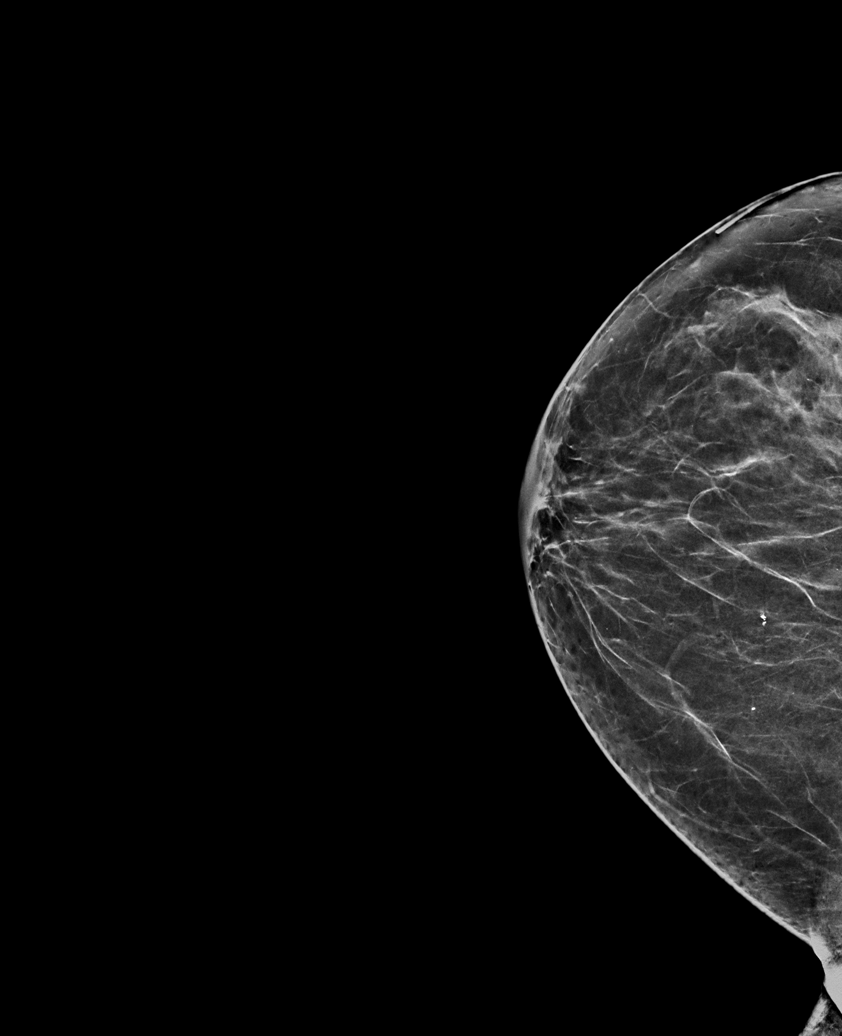

[R MLO synth-2D (1 of 2)]
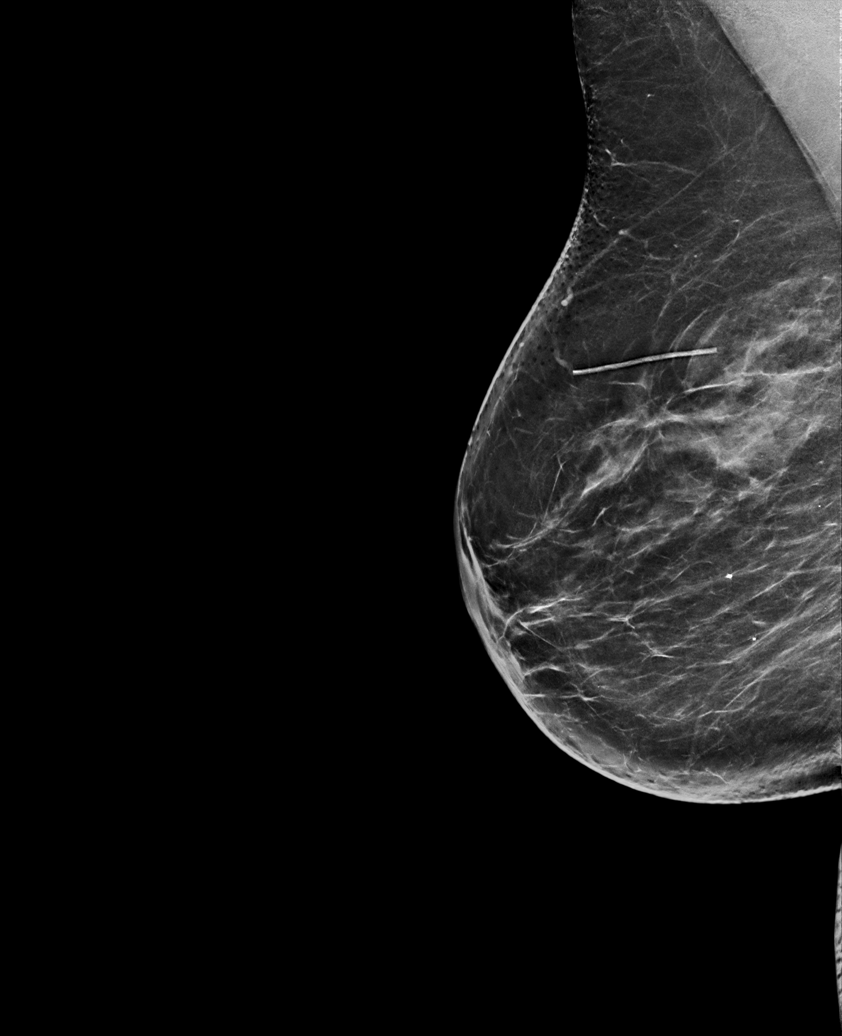

[L MLO synth-2D]
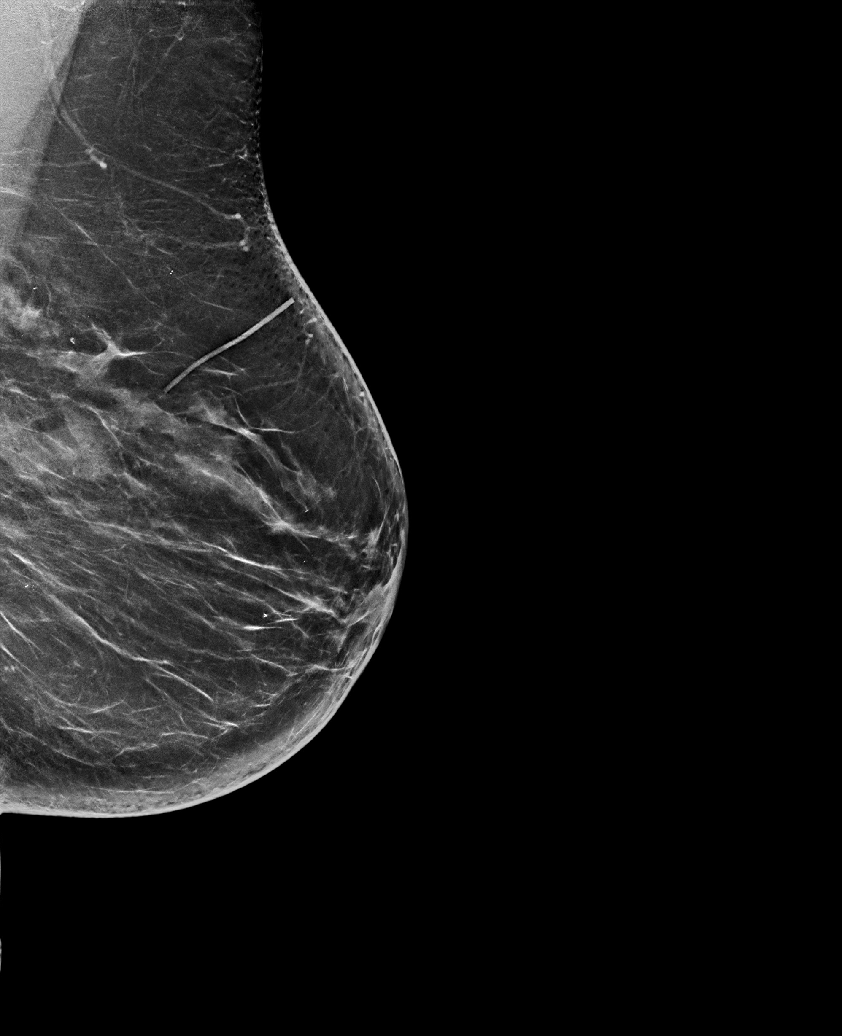

[L CC synth-2D]
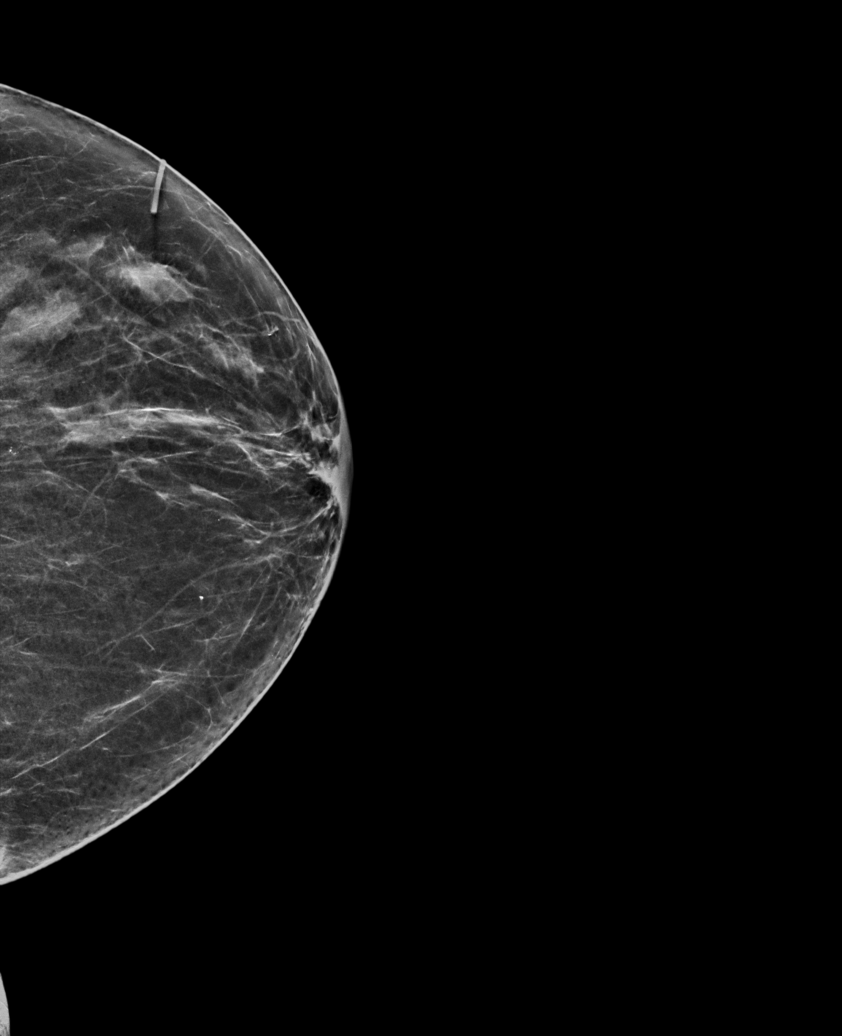

[R MLO synth-2D (2 of 2)]
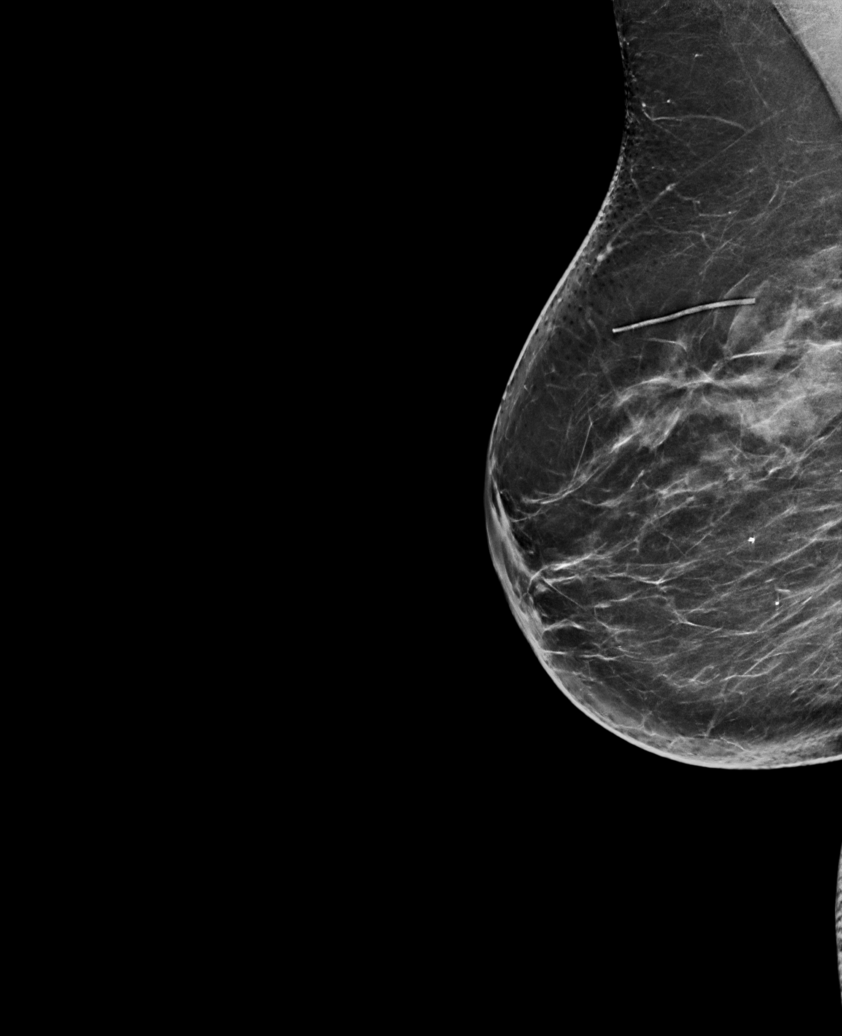

[R CC tomo · tomo slice 33/64.0]
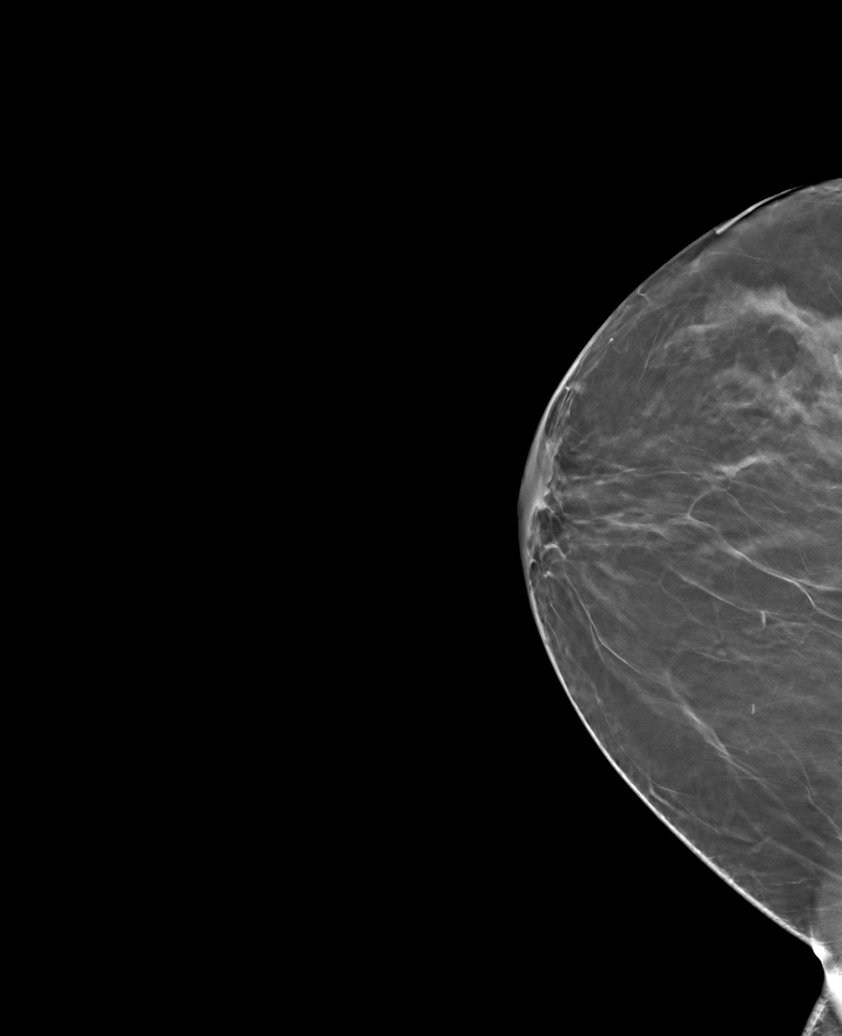

[6 of 30 positions shown; findings below may reference images not displayed]

ACR Breast Density Category b: There are scattered areas of
fibroglandular density.
FINDINGS: There are no findings suspicious for malignancy.
IMPRESSION: No mammographic evidence of malignancy. A result letter of this
screening mammogram will be mailed directly to the patient.

RECOMMENDATION:
Screening mammogram in one year. (Code:51-O-LD2)

BI-RADS CATEGORY  1: Negative.

## 2022-08-26 ENCOUNTER — Other Ambulatory Visit: Payer: Self-pay | Admitting: Family Medicine

## 2022-08-26 DIAGNOSIS — R0989 Other specified symptoms and signs involving the circulatory and respiratory systems: Secondary | ICD-10-CM

## 2022-09-02 ENCOUNTER — Ambulatory Visit
Admission: RE | Admit: 2022-09-02 | Discharge: 2022-09-02 | Disposition: A | Discharge status: Home / Self Care | Payer: Medicare Other | Source: Ambulatory Visit | Attending: Family Medicine | Admitting: Family Medicine

## 2022-09-02 DIAGNOSIS — R0989 Other specified symptoms and signs involving the circulatory and respiratory systems: Secondary | ICD-10-CM | POA: Diagnosis not present

## 2022-11-02 ENCOUNTER — Other Ambulatory Visit: Payer: Self-pay | Admitting: Physician Assistant

## 2022-11-02 DIAGNOSIS — Z1231 Encounter for screening mammogram for malignant neoplasm of breast: Secondary | ICD-10-CM

## 2022-12-24 ENCOUNTER — Other Ambulatory Visit: Payer: Self-pay | Admitting: Physician Assistant

## 2022-12-24 DIAGNOSIS — Z780 Asymptomatic menopausal state: Secondary | ICD-10-CM

## 2022-12-30 ENCOUNTER — Ambulatory Visit
Admission: RE | Admit: 2022-12-30 | Discharge: 2022-12-30 | Disposition: A | Discharge status: Home / Self Care | Payer: Medicare Other | Source: Ambulatory Visit | Attending: Physician Assistant | Admitting: Physician Assistant

## 2022-12-30 DIAGNOSIS — Z1231 Encounter for screening mammogram for malignant neoplasm of breast: Secondary | ICD-10-CM | POA: Diagnosis present

## 2023-01-10 ENCOUNTER — Ambulatory Visit
Admission: RE | Admit: 2023-01-10 | Discharge: 2023-01-10 | Disposition: A | Discharge status: Home / Self Care | Payer: Medicare Other | Source: Ambulatory Visit | Attending: Physician Assistant | Admitting: Physician Assistant

## 2023-01-10 DIAGNOSIS — Z780 Asymptomatic menopausal state: Secondary | ICD-10-CM

## 2023-10-02 ENCOUNTER — Emergency Department
Admission: EM | Admit: 2023-10-02 | Discharge: 2023-10-02 | Disposition: A | Discharge status: Home / Self Care | Payer: Medicare Other | Attending: Emergency Medicine | Admitting: Emergency Medicine

## 2023-10-02 ENCOUNTER — Other Ambulatory Visit: Payer: Self-pay

## 2023-10-02 DIAGNOSIS — Z794 Long term (current) use of insulin: Secondary | ICD-10-CM | POA: Insufficient documentation

## 2023-10-02 DIAGNOSIS — E860 Dehydration: Secondary | ICD-10-CM | POA: Diagnosis not present

## 2023-10-02 DIAGNOSIS — N390 Urinary tract infection, site not specified: Secondary | ICD-10-CM | POA: Diagnosis not present

## 2023-10-02 DIAGNOSIS — R112 Nausea with vomiting, unspecified: Secondary | ICD-10-CM | POA: Diagnosis present

## 2023-10-02 DIAGNOSIS — I10 Essential (primary) hypertension: Secondary | ICD-10-CM | POA: Diagnosis not present

## 2023-10-02 DIAGNOSIS — E119 Type 2 diabetes mellitus without complications: Secondary | ICD-10-CM | POA: Insufficient documentation

## 2023-10-02 LAB — COMPREHENSIVE METABOLIC PANEL
ALT: 33 U/L (ref 0–44)
AST: 48 U/L — ABNORMAL HIGH (ref 15–41)
Albumin: 4.1 g/dL (ref 3.5–5.0)
Alkaline Phosphatase: 69 U/L (ref 38–126)
Anion gap: 14 (ref 5–15)
BUN: 24 mg/dL — ABNORMAL HIGH (ref 8–23)
CO2: 25 mmol/L (ref 22–32)
Calcium: 9.7 mg/dL (ref 8.9–10.3)
Chloride: 98 mmol/L (ref 98–111)
Creatinine, Ser: 1.13 mg/dL — ABNORMAL HIGH (ref 0.44–1.00)
GFR, Estimated: 53 mL/min — ABNORMAL LOW (ref >60)
Glucose, Bld: 258 mg/dL — ABNORMAL HIGH (ref 70–99)
Potassium: 3.5 mmol/L (ref 3.5–5.1)
Sodium: 137 mmol/L (ref 135–145)
Total Bilirubin: 0.8 mg/dL (ref 0.3–1.2)
Total Protein: 8.1 g/dL (ref 6.5–8.1)

## 2023-10-02 LAB — URINALYSIS, ROUTINE W REFLEX MICROSCOPIC
Bilirubin Urine: NEGATIVE
Glucose, UA: >=500 mg/dL — AB
Ketones, ur: 20 mg/dL — AB
Nitrite: POSITIVE — AB
Protein, ur: >=300 mg/dL — AB
RBC / HPF: >50 RBC/hpf (ref 0–5)
Specific Gravity, Urine: 1.033 — ABNORMAL HIGH (ref 1.005–1.030)
WBC, UA: >50 WBC/hpf (ref 0–5)
pH: 5 (ref 5.0–8.0)

## 2023-10-02 LAB — CBC
HCT: 39.7 % (ref 36.0–46.0)
Hemoglobin: 13.1 g/dL (ref 12.0–15.0)
MCH: 30.5 pg (ref 26.0–34.0)
MCHC: 33 g/dL (ref 30.0–36.0)
MCV: 92.3 fL (ref 80.0–100.0)
Platelets: 370 10*3/uL (ref 150–400)
RBC: 4.3 MIL/uL (ref 3.87–5.11)
RDW: 14.6 % (ref 11.5–15.5)
WBC: 10.5 10*3/uL (ref 4.0–10.5)
nRBC: 0 % (ref 0.0–0.2)

## 2023-10-02 LAB — LIPASE, BLOOD: Lipase: 24 U/L (ref 11–51)

## 2023-10-02 MED ORDER — CEFDINIR 300 MG PO CAPS: 300.0000 mg | ORAL_CAPSULE | Freq: Two times a day (BID) | ORAL | 0 refills | Status: AC

## 2023-10-02 MED ORDER — ONDANSETRON HCL 4 MG PO TABS: 4.0000 mg | ORAL_TABLET | Freq: Every day | ORAL | 1 refills | Status: AC | PRN

## 2023-10-02 MED ORDER — SODIUM CHLORIDE 0.9 % IV SOLN
1.0000 g | Freq: Once | INTRAVENOUS | Status: AC
  Administered 2023-10-02: 1 g via INTRAVENOUS
  Filled 2023-10-02: qty 10

## 2023-10-02 MED ORDER — METOCLOPRAMIDE HCL 5 MG/ML IJ SOLN
10.0000 mg | Freq: Once | INTRAMUSCULAR | Status: AC
  Administered 2023-10-02: 10 mg via INTRAVENOUS
  Filled 2023-10-02: qty 2

## 2023-10-02 MED ORDER — SODIUM CHLORIDE 0.9 % IV BOLUS
1000.0000 mL | Freq: Once | INTRAVENOUS | Status: AC
  Administered 2023-10-02: 1000 mL via INTRAVENOUS

## 2023-10-02 MED ORDER — ONDANSETRON HCL 4 MG/2ML IJ SOLN
4.0000 mg | Freq: Once | INTRAMUSCULAR | Status: AC | PRN
  Administered 2023-10-02: 4 mg via INTRAVENOUS
  Filled 2023-10-02: qty 2

## 2023-10-02 NOTE — ED Notes (Signed)
Pt asking for something to drink. Told pt would ask the Dr.

## 2023-10-02 NOTE — ED Provider Notes (Signed)
[facility] Provider Note    Event Date/Time   First MD Initiated Contact with Patient 10/02/23 0900     (approximate)   History   Emesis   HPI  Janet Sims is a 72 y.o. female   Past medical history of diabetes on insulin, hypertension, paroxysmal SVT, here for nausea and vomiting starting yesterday.  No known sick contacts.  Bowel movements and flatus normal.  No diarrhea or GI bleeding.  No urinary symptoms.  She denies abdominal pain.  She denies chest pain, respiratory complaints, and does state that she occasionally gets nauseated and vomiting.  She has been compliant with all medications including her diabetic medications and denies any changes in her diabetic medications recently.  She has never been in DKA.  Independent Historian contributed to assessment above: Her family members at bedside to corroborate information past medical history as above       Physical Exam   Triage Vital Signs: ED Triage Vitals  Encounter Vitals Group     BP 10/02/23 0847 (!) 129/103     Systolic BP Percentile --      Diastolic BP Percentile --      Pulse Rate 10/02/23 0847 (!) 113     Resp 10/02/23 0847 19     Temp 10/02/23 0847 98.4 F (36.9 C)     Temp src --      SpO2 10/02/23 0847 100 %     Weight 10/02/23 0850 180 lb (81.6 kg)     Height 10/02/23 0850 5\' 2"  (1.575 m)     Head Circumference --      Peak Flow --      Pain Score 10/02/23 0850 0     Pain Loc --      Pain Education --      Exclude from Growth Chart --     Most recent vital signs: Vitals:   10/02/23 0847  BP: (!) 129/103  Pulse: (!) 113  Resp: 19  Temp: 98.4 F (36.9 C)  SpO2: 100%    General: Awake, no distress.  CV:  Good peripheral perfusion.  Resp:  Normal effort.  Abd:  No distention.  Other:  She appears uncomfortable dry retching in the stretcher.  She has a soft nontender abdomen nondistended no masses, no rigidity or guarding.  She has dry mucous  membranes she looks dehydrated.   ED Results / Procedures / Treatments   Labs (all labs ordered are listed, but only abnormal results are displayed) Labs Reviewed  COMPREHENSIVE METABOLIC PANEL - Abnormal; Notable for the following components:      Result Value   Glucose, Bld 258 (*)    BUN 24 (*)    Creatinine, Ser 1.13 (*)    AST 48 (*)    GFR, Estimated 53 (*)    All other components within normal limits  URINALYSIS, ROUTINE W REFLEX MICROSCOPIC - Abnormal; Notable for the following components:   Color, Urine AMBER (*)    APPearance CLOUDY (*)    Specific Gravity, Urine 1.033 (*)    Glucose, UA >=500 (*)    Hgb urine dipstick MODERATE (*)    Ketones, ur 20 (*)    Protein, ur >=300 (*)    Nitrite POSITIVE (*)    Leukocytes,Ua TRACE (*)    Bacteria, UA MANY (*)    All other components within normal limits  LIPASE, BLOOD  CBC     I ordered and reviewed the above labs  they are notable for nitrite positive urine with bacteria  EKG  ED ECG REPORT I, Pilar Jarvis, the attending physician, personally viewed and interpreted this ECG.   Date: 10/02/2023  EKG Time: 0916  Rate: 100  Rhythm: sinus tachycardia  Axis: nl  Intervals:none  ST&T Change: no stemi  PROCEDURES:  Critical Care performed: No  Procedures   MEDICATIONS ORDERED IN ED: Medications  ondansetron (ZOFRAN) injection 4 mg (has no administration in time range)  cefTRIAXone (ROCEPHIN) 1 g in sodium chloride 0.9 % 100 mL IVPB (1 g Intravenous New Bag/Given 10/02/23 0952)  sodium chloride 0.9 % bolus 1,000 mL (0 mLs Intravenous Stopped 10/02/23 0952)  metoCLOPramide (REGLAN) injection 10 mg (10 mg Intravenous Given 10/02/23 0912)     IMPRESSION / MDM / ASSESSMENT AND PLAN / ED COURSE  I reviewed the triage vital signs and the nursing notes.                                Patient's presentation is most consistent with acute presentation with potential threat to life or bodily  function.  Differential diagnosis includes, but is not limited to, dehydration electrolyte derangements, DKA, enteritis, intra-abdominal infection or obstruction, urinary tract infection   The patient is on the cardiac monitor to evaluate for evidence of arrhythmia and/or significant heart rate changes.  MDM:    72 year old diabetic who presents with nausea and vomiting since yesterday.  She has occasional nausea/vomiting episodes but this 1 is worse than normal.  She looks dehydrated.  She does have a soft benign abdominal exam so I doubt surgical emergency in the setting of a very benign abdominal exam at this time, start with basic labs, LFTs, lipase, urinalysis and therapeutics including IV crystalloid infusion for dehydration and Reglan for antiemetic.  -- UA consistent with urinary tract infection, will treat with 1 dose of IV Rocephin in the emergency department and prescribe oral antibiotics as an outpatient.  Feels better with antiemetic no longer vomiting and tolerating p.o.  Heart rate improved to 100.    I considered hospitalization but given that she is no longer vomiting, does not appear septic, and is an otherwise healthy 72 year old woman who prefers to go home, we will start with outpatient treatment and PMD follow-up.  She understands return if any new or worsening.      FINAL CLINICAL IMPRESSION(S) / ED DIAGNOSES   Final diagnoses:  Dehydration  Nausea and vomiting, unspecified vomiting type  Lower urinary tract infectious disease     Rx / DC Orders   ED Discharge Orders          Ordered    ondansetron (ZOFRAN) 4 MG tablet  Daily PRN        10/02/23 0941    cefdinir (OMNICEF) 300 MG capsule  2 times daily        10/02/23 0941             Note:  This document was prepared using Dragon voice recognition software and may include unintentional dictation errors.    Pilar Jarvis, MD 10/02/23 [phone]

## 2023-10-02 NOTE — ED Notes (Signed)
Pt husband at RN station stating pt is ready to leave. Pt in agreement.

## 2023-10-02 NOTE — ED Notes (Addendum)
Pt up out of bed complaining of feeling sick on stomach, no active vomiting or dry heaves. Dr. Modesto Charon informed.

## 2023-10-02 NOTE — Discharge Instructions (Addendum)
Take cefdinir for 7 days for urinary tract infection.  Use Zofran for nausea/vomiting.   Drink plenty of fluids to stay well-hydrated.  Find Pedialyte or similar electrolyte rehydration formulas at your local pharmacy.  Take acetaminophen 650 mg and ibuprofen 400 mg every 6 hours for pain.  Take with food.  Thank you for choosing [place] for your health care today!  Please see your primary doctor this week for a follow up appointment.   If you have any new, worsening, or unexpected symptoms call your doctor right away or come back to the emergency department for reevaluation.  It was my pleasure to care for you today.   Daneil Dan Modesto Charon, MD

## 2023-10-02 NOTE — ED Triage Notes (Signed)
Pt states nausea and vomiting that started last night, but denies abdominal pain. Pt states having a fever last night as well. Pt reports, "just not feeling well"

## 2023-10-02 NOTE — ED Notes (Signed)
Pt complaining of nausea, will give PRN zofran

## 2023-10-02 NOTE — ED Notes (Signed)
Husband out of room asking for water "She needs something to drink now, I mean it's just a cup of water." Pt given water per Dr. Modesto Charon

## 2023-10-19 ENCOUNTER — Other Ambulatory Visit: Payer: Self-pay | Admitting: Physician Assistant

## 2023-10-19 DIAGNOSIS — Z1231 Encounter for screening mammogram for malignant neoplasm of breast: Secondary | ICD-10-CM

## 2024-01-02 ENCOUNTER — Ambulatory Visit
Admission: RE | Admit: 2024-01-02 | Discharge: 2024-01-02 | Disposition: A | Discharge status: Home / Self Care | Payer: Medicare Other | Source: Ambulatory Visit | Attending: Physician Assistant | Admitting: Physician Assistant

## 2024-01-02 DIAGNOSIS — Z1231 Encounter for screening mammogram for malignant neoplasm of breast: Secondary | ICD-10-CM | POA: Diagnosis present

## 2024-01-05 ENCOUNTER — Other Ambulatory Visit: Payer: Self-pay | Admitting: Nephrology

## 2024-01-05 DIAGNOSIS — N1832 Chronic kidney disease, stage 3b: Secondary | ICD-10-CM

## 2024-01-05 DIAGNOSIS — E1122 Type 2 diabetes mellitus with diabetic chronic kidney disease: Secondary | ICD-10-CM

## 2024-01-05 DIAGNOSIS — R809 Proteinuria, unspecified: Secondary | ICD-10-CM

## 2024-01-10 ENCOUNTER — Ambulatory Visit
Admission: RE | Admit: 2024-01-10 | Discharge: 2024-01-10 | Disposition: A | Discharge status: Home / Self Care | Payer: Medicare Other | Source: Ambulatory Visit | Attending: Nephrology | Admitting: Nephrology

## 2024-01-10 DIAGNOSIS — R809 Proteinuria, unspecified: Secondary | ICD-10-CM | POA: Diagnosis present

## 2024-01-10 DIAGNOSIS — E1122 Type 2 diabetes mellitus with diabetic chronic kidney disease: Secondary | ICD-10-CM | POA: Diagnosis present

## 2024-01-10 DIAGNOSIS — N1832 Chronic kidney disease, stage 3b: Secondary | ICD-10-CM | POA: Diagnosis present

## 2024-12-10 ENCOUNTER — Other Ambulatory Visit: Payer: Self-pay | Admitting: Physician Assistant

## 2024-12-10 DIAGNOSIS — Z1231 Encounter for screening mammogram for malignant neoplasm of breast: Secondary | ICD-10-CM

## 2025-01-01 ENCOUNTER — Other Ambulatory Visit: Payer: Self-pay | Admitting: Family Medicine

## 2025-01-01 DIAGNOSIS — Z780 Asymptomatic menopausal state: Secondary | ICD-10-CM

## 2025-01-03 ENCOUNTER — Ambulatory Visit
Admission: RE | Admit: 2025-01-03 | Discharge: 2025-01-03 | Disposition: A | Discharge status: Home / Self Care | Source: Ambulatory Visit | Attending: Physician Assistant | Admitting: Physician Assistant

## 2025-01-03 DIAGNOSIS — Z1231 Encounter for screening mammogram for malignant neoplasm of breast: Secondary | ICD-10-CM | POA: Insufficient documentation

## 2025-01-09 ENCOUNTER — Other Ambulatory Visit: Payer: Self-pay | Admitting: Physician Assistant

## 2025-01-09 DIAGNOSIS — R928 Other abnormal and inconclusive findings on diagnostic imaging of breast: Secondary | ICD-10-CM

## 2025-01-15 ENCOUNTER — Encounter

## 2025-01-15 ENCOUNTER — Other Ambulatory Visit

## 2025-01-28 ENCOUNTER — Ambulatory Visit
